# Patient Record
Sex: Female | Born: 1985 | Race: White | Hispanic: No | Marital: Single | Smoking: Never smoker
Health system: Southern US, Community
[De-identification: ages and names within clinical notes are randomized; demographics above are authoritative.]

## PROBLEM LIST (undated history)

## (undated) DIAGNOSIS — I319 Disease of pericardium, unspecified: Secondary | ICD-10-CM

## (undated) DIAGNOSIS — R0789 Other chest pain: Secondary | ICD-10-CM

## (undated) DIAGNOSIS — K219 Gastro-esophageal reflux disease without esophagitis: Secondary | ICD-10-CM

## (undated) DIAGNOSIS — R55 Syncope and collapse: Secondary | ICD-10-CM

## (undated) DIAGNOSIS — R079 Chest pain, unspecified: Secondary | ICD-10-CM

## (undated) DIAGNOSIS — M549 Dorsalgia, unspecified: Secondary | ICD-10-CM

## (undated) DIAGNOSIS — I209 Angina pectoris, unspecified: Secondary | ICD-10-CM

## (undated) DIAGNOSIS — F419 Anxiety disorder, unspecified: Secondary | ICD-10-CM

## (undated) DIAGNOSIS — D369 Benign neoplasm, unspecified site: Secondary | ICD-10-CM

## (undated) DIAGNOSIS — R200 Anesthesia of skin: Secondary | ICD-10-CM

## (undated) DIAGNOSIS — J45909 Unspecified asthma, uncomplicated: Secondary | ICD-10-CM

## (undated) DIAGNOSIS — M79602 Pain in left arm: Secondary | ICD-10-CM

## (undated) HISTORY — DX: Disease of pericardium, unspecified: I31.9

## (undated) HISTORY — DX: Other chest pain: R07.89

## (undated) HISTORY — DX: Benign neoplasm, unspecified site: D36.9

## (undated) HISTORY — PX: WISDOM TOOTH EXTRACTION: SHX21

## (undated) HISTORY — DX: Anesthesia of skin: R20.0

## (undated) HISTORY — DX: Chest pain, unspecified: R07.9

## (undated) HISTORY — DX: Dorsalgia, unspecified: M54.9

## (undated) HISTORY — DX: Syncope and collapse: R55

## (undated) HISTORY — PX: TONSILLECTOMY: SUR1361

## (undated) HISTORY — DX: Anxiety disorder, unspecified: F41.9

## (undated) HISTORY — DX: Pain in left arm: M79.602

---

## 2014-12-17 ENCOUNTER — Encounter (HOSPITAL_COMMUNITY): Payer: Self-pay | Admitting: Emergency Medicine

## 2014-12-17 ENCOUNTER — Emergency Department (HOSPITAL_COMMUNITY)
Admission: EM | Admit: 2014-12-17 | Discharge: 2014-12-17 | Disposition: A | Discharge status: Home / Self Care | Payer: BLUE CROSS/BLUE SHIELD | Source: Home / Self Care | Attending: Family Medicine | Admitting: Family Medicine

## 2014-12-17 DIAGNOSIS — J029 Acute pharyngitis, unspecified: Secondary | ICD-10-CM

## 2014-12-17 LAB — POCT RAPID STREP A: Streptococcus, Group A Screen (Direct): NEGATIVE

## 2014-12-17 MED ORDER — AMOXICILLIN 500 MG PO CAPS: 500.0000 mg | ORAL_CAPSULE | Freq: Three times a day (TID) | ORAL | Status: DC

## 2014-12-17 NOTE — ED Notes (Signed)
Pt has been suffering from a high fever and sore throat for 3-4 days.  Pt has been taking Day-Quil and Ibuprofen.

## 2014-12-17 NOTE — ED Provider Notes (Signed)
CSN: [id number]     Arrival date & time 12/17/14  1355 History   First MD Initiated Contact with Patient 12/17/14 1440     Chief Complaint  Patient presents with  . Sore Throat  . Fever   (Consider location/radiation/quality/duration/timing/severity/associated sxs/prior Treatment) HPI  History reviewed. No pertinent past medical history. Past Surgical History  Procedure Laterality Date  . Tonsillectomy    . Wisdom tooth extraction     History reviewed. No pertinent family history. History  Substance Use Topics  . Smoking status: Never Smoker   . Smokeless tobacco: Never Used  . Alcohol Use: Yes     Comment: occasiona   OB History    No data available     Review of Systems  Allergies  Review of patient's allergies indicates no known allergies.  Home Medications   Prior to Admission medications   Medication Sig Start Date End Date Taking? Authorizing Provider  ibuprofen (ADVIL,MOTRIN) 400 MG tablet Take 400 mg by mouth every 6 (six) hours as needed for fever.   Yes Historical Provider, MD  amoxicillin (AMOXIL) 500 MG capsule Take 1 capsule (500 mg total) by mouth 3 (three) times daily. 12/17/14   Billy Fischer, MD   BP 120/79 mmHg  Pulse 109  Temp(Src) 102.7 F (39.3 C) (Oral)  Resp 16  SpO2 100%  LMP 11/19/2014 (Exact Date) Physical Exam  Nursing note and vitals reviewed.   ED Course  Procedures (including critical care time) Labs Review Labs Reviewed  POCT RAPID STREP A    Imaging Review No results found.   MDM   1. Acute pharyngitis, unspecified pharyngitis type     Patient seen by Dr. Juventino Slovak.  Refer to his note  Robyn Haber, MD   Robyn Haber, MD 12/17/14 [phone]

## 2014-12-17 NOTE — Discharge Instructions (Signed)
Drink lots of fluids, take all of medicine, use lozenges as needed.return if needed °

## 2014-12-17 NOTE — ED Provider Notes (Signed)
CSN: [id number]     Arrival date & time 12/17/14  1355 History   First MD Initiated Contact with Patient 12/17/14 1440     Chief Complaint  Patient presents with  . Sore Throat  . Fever   (Consider location/radiation/quality/duration/timing/severity/associated sxs/prior Treatment) Patient is a 29 y.o. female presenting with pharyngitis and fever. The history is provided by the patient.  Sore Throat This is a new problem. The current episode started more than 2 days ago. The problem has been gradually worsening. Pertinent negatives include no chest pain and no abdominal pain. The symptoms are aggravated by swallowing.  Fever Associated symptoms: chills and sore throat   Associated symptoms: no chest pain and no cough     History reviewed. No pertinent past medical history. Past Surgical History  Procedure Laterality Date  . Tonsillectomy    . Wisdom tooth extraction     History reviewed. No pertinent family history. History  Substance Use Topics  . Smoking status: Never Smoker   . Smokeless tobacco: Never Used  . Alcohol Use: Yes     Comment: occasiona   OB History    No data available     Review of Systems  Constitutional: Positive for fever, chills and appetite change.  HENT: Positive for sore throat.   Respiratory: Negative for cough.   Cardiovascular: Negative for chest pain.  Gastrointestinal: Negative.  Negative for abdominal pain.    Allergies  Review of patient's allergies indicates no known allergies.  Home Medications   Prior to Admission medications   Medication Sig Start Date End Date Taking? Authorizing Provider  ibuprofen (ADVIL,MOTRIN) 400 MG tablet Take 400 mg by mouth every 6 (six) hours as needed for fever.   Yes Historical Provider, MD  amoxicillin (AMOXIL) 500 MG capsule Take 1 capsule (500 mg total) by mouth 3 (three) times daily. 12/17/14   Billy Fischer, MD   BP 120/79 mmHg  Pulse 109  Temp(Src) 102.7 F (39.3 C) (Oral)  Resp 16  SpO2 100%   LMP 11/19/2014 (Exact Date) Physical Exam  Constitutional: She appears well-developed and well-nourished.  HENT:  Right Ear: External ear normal.  Left Ear: External ear normal.  Mouth/Throat: Oropharyngeal exudate present.  Eyes: Conjunctivae are normal. Pupils are equal, round, and reactive to light.  Neck: Normal range of motion. Neck supple.  Lymphadenopathy:    She has cervical adenopathy.  Nursing note and vitals reviewed.   ED Course  Procedures (including critical care time) Labs Review Labs Reviewed  POCT RAPID STREP A  strep neg.  Imaging Review No results found.   MDM   1. Acute pharyngitis, unspecified pharyngitis type        Billy Fischer, MD 12/17/14 1500

## 2014-12-19 LAB — CULTURE, GROUP A STREP: Strep A Culture: NEGATIVE

## 2014-12-20 NOTE — ED Notes (Signed)
Final report of strep test negative for strep

## 2015-06-06 ENCOUNTER — Encounter (HOSPITAL_COMMUNITY): Payer: Self-pay

## 2015-06-06 ENCOUNTER — Emergency Department (HOSPITAL_COMMUNITY)
Admission: EM | Admit: 2015-06-06 | Discharge: 2015-06-06 | Disposition: A | Discharge status: Home / Self Care | Payer: BLUE CROSS/BLUE SHIELD | Source: Home / Self Care

## 2015-06-06 DIAGNOSIS — K529 Noninfective gastroenteritis and colitis, unspecified: Secondary | ICD-10-CM

## 2015-06-06 MED ORDER — ONDANSETRON HCL 4 MG PO TABS: 4.0000 mg | ORAL_TABLET | Freq: Four times a day (QID) | ORAL | Status: DC

## 2015-06-06 MED ORDER — ONDANSETRON 4 MG PO TBDP
ORAL_TABLET | ORAL | Status: AC
  Filled 2015-06-06: qty 1

## 2015-06-06 MED ORDER — ONDANSETRON 4 MG PO TBDP
4.0000 mg | ORAL_TABLET | Freq: Once | ORAL | Status: AC
  Administered 2015-06-06: 4 mg via ORAL

## 2015-06-06 NOTE — Discharge Instructions (Signed)

## 2015-06-06 NOTE — ED Provider Notes (Signed)
CSN: [id number]     Arrival date & time 06/06/15  1301 History   None    Chief Complaint  Patient presents with  . GI Problem   (Consider location/radiation/quality/duration/timing/severity/associated sxs/prior Treatment) HPI History obtained from patient:   LOCATION:abdomen SEVERITY: DURATION:2 days CONTEXT:symptoms xmas eve better christmas day but since 3 am constant vomiting and diarrhea QUALITY: MODIFYING FACTORS: unable to drink without vomiting ASSOCIATED SYMPTOMS:diarrhea TIMING:constant OCCUPATION:  History reviewed. No pertinent past medical history. Past Surgical History  Procedure Laterality Date  . Tonsillectomy    . Wisdom tooth extraction     History reviewed. No pertinent family history. Social History  Substance Use Topics  . Smoking status: Never Smoker   . Smokeless tobacco: Never Used  . Alcohol Use: Yes     Comment: occasiona   OB History    No data available     Review of Systems ROS +'ve vomiting, diarrhea  Denies: HEADACHE, NAUSEA, ABDOMINAL PAIN, CHEST PAIN, CONGESTION, DYSURIA, SHORTNESS OF BREATH  Allergies  Review of patient's allergies indicates no known allergies.  Home Medications   Prior to Admission medications   Medication Sig Start Date End Date Taking? Authorizing Provider  amoxicillin (AMOXIL) 500 MG capsule Take 1 capsule (500 mg total) by mouth 3 (three) times daily. 12/17/14   Billy Fischer, MD  ibuprofen (ADVIL,MOTRIN) 400 MG tablet Take 400 mg by mouth every 6 (six) hours as needed for fever.    Historical Provider, MD   Meds Ordered and Administered this Visit   Medications  ondansetron (ZOFRAN-ODT) disintegrating tablet 4 mg (not administered)    BP 125/85 mmHg  Pulse 97  Temp(Src) 98.6 F (37 C) (Oral)  Resp 16  SpO2 100% No data found.   Physical Exam  Constitutional: She is oriented to person, place, and time. She appears well-developed and well-nourished. No distress.  HENT:  Head: Normocephalic and  atraumatic.  Eyes: Conjunctivae are normal.  Cardiovascular: Normal rate, regular rhythm and normal heart sounds.   Pulmonary/Chest: Effort normal.  Abdominal: Soft. Bowel sounds are normal. There is no tenderness. There is no rebound and no guarding.  Musculoskeletal: Normal range of motion.  Neurological: She is alert and oriented to person, place, and time.  Skin: Skin is warm and dry.  Psychiatric: She has a normal mood and affect. Her behavior is normal. Judgment and thought content normal.  Nursing note and vitals reviewed.   ED Course  Procedures (including critical care time)  Labs Review Labs Reviewed - No data to display  Imaging Review No results found.   Visual Acuity Review  Right Eye Distance:   Left Eye Distance:   Bilateral Distance:    Right Eye Near:   Left Eye Near:    Bilateral Near:        Medication improved symptoms, able to take clear liquids prior to discharge from UC.  MDM   1. Gastroenteritis     Patient is advised to continue home symptomatic treatment. Prescription for zofran is provided to the patient. Patient is advised that if there are new or worsening symptoms or attend the emergency department, or contact primary care provider. Instructions of care provided discharged home in stable condition.  THIS NOTE WAS GENERATED USING A VOICE RECOGNITION SOFTWARE PROGRAM. ALL REASONABLE EFFORTS  WERE MADE TO PROOFREAD THIS DOCUMENT FOR ACCURACY.          Konrad Felix, PA 06/06/15 [phone]

## 2015-06-06 NOTE — ED Notes (Signed)
Here w her partner who was ill w GI problem a couple of days ago. C/o general GI discomfort, n/v/d. NAD

## 2015-07-31 ENCOUNTER — Emergency Department (HOSPITAL_COMMUNITY)
Admission: EM | Admit: 2015-07-31 | Discharge: 2015-07-31 | Disposition: A | Discharge status: Home / Self Care | Payer: BLUE CROSS/BLUE SHIELD | Source: Home / Self Care | Attending: Family Medicine | Admitting: Family Medicine

## 2015-07-31 ENCOUNTER — Encounter (HOSPITAL_COMMUNITY): Payer: Self-pay | Admitting: *Deleted

## 2015-07-31 DIAGNOSIS — K047 Periapical abscess without sinus: Secondary | ICD-10-CM | POA: Diagnosis not present

## 2015-07-31 MED ORDER — HYDROCODONE-ACETAMINOPHEN 5-325 MG PO TABS: 1.0000 | ORAL_TABLET | Freq: Four times a day (QID) | ORAL | Status: DC | PRN

## 2015-07-31 MED ORDER — CLINDAMYCIN HCL 150 MG PO CAPS: 150.0000 mg | ORAL_CAPSULE | Freq: Four times a day (QID) | ORAL | Status: DC

## 2015-07-31 NOTE — Discharge Instructions (Signed)
Take medicine as prescribed, see your dentist as soon as possible °

## 2015-07-31 NOTE — ED Notes (Signed)
Started with right upper toothache 2-3 days ago; yesterday started with swelling; today pain and swelling worse.  Has been taking IBU without any relief.

## 2015-07-31 NOTE — ED Provider Notes (Signed)
CSN: [id number]     Arrival date & time 07/31/15  1609 History   First MD Initiated Contact with Patient 07/31/15 1811     Chief Complaint  Patient presents with  . Dental Pain   (Consider location/radiation/quality/duration/timing/severity/associated sxs/prior Treatment) Patient is a 30 y.o. female presenting with tooth pain. The history is provided by the patient.  Dental Pain Location:  Upper Upper teeth location:  5/RU 1st bicuspid Quality:  Throbbing Severity:  Moderate Onset quality:  Gradual Duration:  3 days Chronicity:  New Context: abscess     History reviewed. No pertinent past medical history. Past Surgical History  Procedure Laterality Date  . Tonsillectomy    . Wisdom tooth extraction     No family history on file. Social History  Substance Use Topics  . Smoking status: Never Smoker   . Smokeless tobacco: Never Used  . Alcohol Use: Yes     Comment: socially   OB History    No data available     Review of Systems  Constitutional: Negative.   HENT: Positive for dental problem.   All other systems reviewed and are negative.   Allergies  Review of patient's allergies indicates no known allergies.  Home Medications   Prior to Admission medications   Medication Sig Start Date End Date Taking? Authorizing Provider  ibuprofen (ADVIL,MOTRIN) 400 MG tablet Take 400 mg by mouth every 6 (six) hours as needed for fever.   Yes Historical Provider, MD  amoxicillin (AMOXIL) 500 MG capsule Take 1 capsule (500 mg total) by mouth 3 (three) times daily. 12/17/14   Billy Fischer, MD  clindamycin (CLEOCIN) 150 MG capsule Take 1 capsule (150 mg total) by mouth 4 (four) times daily. 07/31/15   Billy Fischer, MD  HYDROcodone-acetaminophen (NORCO/VICODIN) 5-325 MG tablet Take 1 tablet by mouth every 6 (six) hours as needed. 07/31/15   Billy Fischer, MD  ondansetron (ZOFRAN) 4 MG tablet Take 1 tablet (4 mg total) by mouth every 6 (six) hours. 06/06/15   Konrad Felix, PA    Meds Ordered and Administered this Visit  Medications - No data to display  BP 131/82 mmHg  Pulse 63  Temp(Src) 98.5 F (36.9 C) (Oral)  Resp 17  SpO2 100%  LMP 07/10/2015 (Approximate) No data found.   Physical Exam  Constitutional: She appears well-developed and well-nourished. She appears distressed.  HENT:  Mouth/Throat: Dental abscesses present.    Nursing note and vitals reviewed.   ED Course  Procedures (including critical care time)  Labs Review Labs Reviewed - No data to display  Imaging Review No results found.   Visual Acuity Review  Right Eye Distance:   Left Eye Distance:   Bilateral Distance:    Right Eye Near:   Left Eye Near:    Bilateral Near:         MDM   1. Dental abscess    Meds ordered this encounter  Medications  . clindamycin (CLEOCIN) 150 MG capsule    Sig: Take 1 capsule (150 mg total) by mouth 4 (four) times daily.    Dispense:  28 capsule    Refill:  0  . HYDROcodone-acetaminophen (NORCO/VICODIN) 5-325 MG tablet    Sig: Take 1 tablet by mouth every 6 (six) hours as needed.    Dispense:  6 tablet    Refill:  0       Billy Fischer, MD 07/31/15 [phone]

## 2016-02-10 DIAGNOSIS — H9312 Tinnitus, left ear: Secondary | ICD-10-CM | POA: Insufficient documentation

## 2016-02-10 DIAGNOSIS — H93292 Other abnormal auditory perceptions, left ear: Secondary | ICD-10-CM | POA: Insufficient documentation

## 2016-09-12 ENCOUNTER — Emergency Department (HOSPITAL_COMMUNITY): Payer: BLUE CROSS/BLUE SHIELD

## 2016-09-12 ENCOUNTER — Encounter (HOSPITAL_COMMUNITY): Payer: Self-pay

## 2016-09-12 ENCOUNTER — Emergency Department (HOSPITAL_COMMUNITY)
Admission: EM | Admit: 2016-09-12 | Discharge: 2016-09-12 | Disposition: A | Discharge status: Home / Self Care | Payer: BLUE CROSS/BLUE SHIELD | Attending: Emergency Medicine | Admitting: Emergency Medicine

## 2016-09-12 DIAGNOSIS — B9789 Other viral agents as the cause of diseases classified elsewhere: Secondary | ICD-10-CM

## 2016-09-12 DIAGNOSIS — J069 Acute upper respiratory infection, unspecified: Secondary | ICD-10-CM

## 2016-09-12 DIAGNOSIS — Z79899 Other long term (current) drug therapy: Secondary | ICD-10-CM | POA: Insufficient documentation

## 2016-09-12 DIAGNOSIS — R05 Cough: Secondary | ICD-10-CM | POA: Diagnosis present

## 2016-09-12 LAB — RAPID STREP SCREEN (MED CTR MEBANE ONLY): STREPTOCOCCUS, GROUP A SCREEN (DIRECT): NEGATIVE

## 2016-09-12 MED ORDER — BENZONATATE 100 MG PO CAPS: 100.0000 mg | ORAL_CAPSULE | Freq: Three times a day (TID) | ORAL | 0 refills | Status: DC

## 2016-09-12 NOTE — ED Notes (Signed)
Bed: WA01 Expected date:  Expected time:  Means of arrival:  Comments: 

## 2016-09-12 NOTE — ED Provider Notes (Signed)
[facility] DEPT Provider Note   CSN: [id number] Arrival date & time: 09/12/16  0124     History   Chief Complaint Chief Complaint  Patient presents with  . Cough  . Fever    HPI Tracey Avila is a 31 y.o. female.  The history is provided by the patient and medical records. No language interpreter was used.  Cough  Associated symptoms include chills. Pertinent negatives include no chest pain, no headaches, no sore throat and no shortness of breath.  Fever   Associated symptoms include congestion and cough. Pertinent negatives include no chest pain, no diarrhea, no vomiting, no headaches and no sore throat.   Tracey Avila is an otherwise healthy 31 y.o. female  who presents to the Emergency Department complaining of persistent dry cough and fever x 1 day. associated symptoms include nausea and vomiting. Patient states that she had 3 episodes of emesis yesterday. She has not thrown up today, but has had coughing fits leading to posttussive emesis. Temperature of 103.5 just prior to arrival. She took Tylenol Cold and flu before coming into the ER. No sick contacts. No sore throat, abdominal pain, diarrhea, chest pain, shortness of breath, dysuria or back pain.  History reviewed. No pertinent past medical history.  There are no active problems to display for this patient.   Past Surgical History:  Procedure Laterality Date  . TONSILLECTOMY    . WISDOM TOOTH EXTRACTION      OB History    No data available       Home Medications    Prior to Admission medications   Medication Sig Start Date End Date Taking? Authorizing Provider  amoxicillin (AMOXIL) 500 MG capsule Take 1 capsule (500 mg total) by mouth 3 (three) times daily. 12/17/14   Billy Fischer, MD  benzonatate (TESSALON) 100 MG capsule Take 1 capsule (100 mg total) by mouth every 8 (eight) hours. 09/12/16   Ozella Almond Adriyana Greenbaum, PA-C  clindamycin (CLEOCIN) 150 MG capsule Take 1 capsule (150 mg total) by mouth 4 (four)  times daily. 07/31/15   Billy Fischer, MD  HYDROcodone-acetaminophen (NORCO/VICODIN) 5-325 MG tablet Take 1 tablet by mouth every 6 (six) hours as needed. 07/31/15   Billy Fischer, MD  ibuprofen (ADVIL,MOTRIN) 400 MG tablet Take 400 mg by mouth every 6 (six) hours as needed for fever.    Historical Provider, MD  ondansetron (ZOFRAN) 4 MG tablet Take 1 tablet (4 mg total) by mouth every 6 (six) hours. 06/06/15   Konrad Felix, [place]    Family History History reviewed. No pertinent family history.  Social History Social History  Substance Use Topics  . Smoking status: Never Smoker  . Smokeless tobacco: Never Used  . Alcohol use Yes     Comment: socially     Allergies   Patient has no known allergies.   Review of Systems Review of Systems  Constitutional: Positive for chills and fever.  HENT: Positive for congestion. Negative for sore throat.   Eyes: Negative for visual disturbance.  Respiratory: Positive for cough. Negative for shortness of breath.   Cardiovascular: Negative for chest pain.  Gastrointestinal: Positive for nausea. Negative for abdominal pain, blood in stool, diarrhea and vomiting.  Genitourinary: Negative for dysuria.  Musculoskeletal: Negative for back pain and neck pain.  Skin: Negative for rash.  Neurological: Negative for headaches.     Physical Exam Updated Vital Signs BP 110/77 (BP Location: Right Arm)   Pulse 80   Temp 99.9 F (  37.7 C) (Oral)   Resp 20   Ht 5\' 6"  (1.676 m)   Wt 84.8 kg   LMP 09/12/2016   SpO2 98%   BMI 30.18 kg/m   Physical Exam  Constitutional: She is oriented to person, place, and time. She appears well-developed and well-nourished. No distress.  HENT:  Head: Normocephalic and atraumatic.  OP with erythema, no exudates or tonsillar hypertrophy. + nasal congestion with mucosal edema.   Neck: Normal range of motion. Neck supple.  No meningeal signs.   Cardiovascular: Normal rate, regular rhythm and normal heart sounds.     Pulmonary/Chest: Effort normal.  Lungs are clear to auscultation bilaterally - no w/r/r  Abdominal: Soft. She exhibits no distension. There is no tenderness.  Musculoskeletal: Normal range of motion.  Neurological: She is alert and oriented to person, place, and time.  Skin: Skin is warm and dry. She is not diaphoretic.  Nursing note and vitals reviewed.    ED Treatments / Results  Labs (all labs ordered are listed, but only abnormal results are displayed) Labs Reviewed  RAPID STREP SCREEN (NOT AT [facility])  CULTURE, GROUP A STREP [facility])    EKG  EKG Interpretation None       Radiology Dg Chest 2 View  Result Date: 09/12/2016 CLINICAL DATA:  31 y/o  F; cough and fever. EXAM: CHEST  2 VIEW COMPARISON:  None. FINDINGS: The heart size and mediastinal contours are within normal limits. Both lungs are clear. Mild levocurvature of the thoracolumbar junction. No acute osseous abnormality is evident. IMPRESSION: No active cardiopulmonary disease. Electronically Signed   By: Kristine Garbe M.D.   On: 09/12/2016 03:21    Procedures Procedures (including critical care time)  Medications Ordered in ED Medications - No data to display   Initial Impression / Assessment and Plan / ED Course  I have reviewed the triage vital signs and the nursing notes.  Pertinent labs & imaging results that were available during my care of the patient were reviewed by me and considered in my medical decision making (see chart for details).    Tracey Avila is afebrile, non-toxic appearing with a clear lung exam. Mild rhinorrhea and OP with erythema, no exudates. CXR normal. Rapid strep negative. Likely viral URI. Patient is agreeable to symptomatic treatment with close follow up with PCP as needed but spoke at length about emergent, changing, or worsening of symptoms that should prompt return to ER. Patient voices understanding and is agreeable to plan.   Blood pressure 110/77, pulse 80,  temperature 99.9 F (37.7 C), temperature source Oral, resp. rate 20, height 5\' 6"  (1.676 m), weight 84.8 kg, last menstrual period 09/12/2016, SpO2 98 %.   Final Clinical Impressions(s) / ED Diagnoses   Final diagnoses:  Viral URI with cough    New Prescriptions Discharge Medication List as of 09/12/2016  4:01 AM    START taking these medications   Details  benzonatate (TESSALON) 100 MG capsule Take 1 capsule (100 mg total) by mouth every 8 (eight) hours., Starting Wed 09/12/2016, Print         [employer] Claudeen Leason, PA-C 09/12/16 [place], MD 09/12/16 [phone]

## 2016-09-12 NOTE — ED Triage Notes (Signed)
Pt complains of a fever and cough for two days, pt took tylenol cold and flu prior to coming

## 2016-09-12 NOTE — Discharge Instructions (Signed)
Flonase and mucinex for nasal congestion, tessalon as needed for cough. Tylenol or ibuprofen as needed for fevers. Rest, drink plenty of fluids to be sure you are staying hydrated. Please follow up with your primary doctor for discussion of your diagnoses and further evaluation after today's visit if symptoms persist longer than 7 days; Return to the ER for high fevers, difficulty breathing or other concerning symptoms

## 2016-09-14 LAB — CULTURE, GROUP A STREP (THRC)

## 2019-05-04 ENCOUNTER — Other Ambulatory Visit: Payer: Self-pay

## 2019-05-04 DIAGNOSIS — Z20822 Contact with and (suspected) exposure to covid-19: Secondary | ICD-10-CM

## 2019-05-06 LAB — NOVEL CORONAVIRUS, NAA: SARS-CoV-2, NAA: NOT DETECTED

## 2021-03-21 ENCOUNTER — Other Ambulatory Visit: Payer: Self-pay

## 2021-03-21 ENCOUNTER — Encounter (HOSPITAL_COMMUNITY): Payer: Self-pay | Admitting: Emergency Medicine

## 2021-03-21 ENCOUNTER — Ambulatory Visit (HOSPITAL_COMMUNITY)
Admission: EM | Admit: 2021-03-21 | Discharge: 2021-03-21 | Disposition: A | Discharge status: Home / Self Care | Payer: BC Managed Care – PPO | Attending: Emergency Medicine | Admitting: Emergency Medicine

## 2021-03-21 DIAGNOSIS — F410 Panic disorder [episodic paroxysmal anxiety] without agoraphobia: Secondary | ICD-10-CM | POA: Diagnosis not present

## 2021-03-21 DIAGNOSIS — R0789 Other chest pain: Secondary | ICD-10-CM

## 2021-03-21 NOTE — Discharge Instructions (Addendum)
-  Your EKG looks good today.  I think that probably your chest pain is more related to panic attacks/stressors, but I cannot completely rule out a cardiac event at urgent care.  If chest pain persist, follow-up with your primary care to discuss outpatient cardiology work-up.  If symptoms worsen, or new concerning symptoms, please head to the emergency department or call 911.

## 2021-03-21 NOTE — ED Triage Notes (Signed)
Pt reports having intermittent chest pain and left arm pains for over a week. Today got worse as driving to work. Seeing a doctor for panic attacks.

## 2021-03-21 NOTE — ED Provider Notes (Signed)
[place]    CSN: [id number] Arrival date & time: 03/21/21  4627      History   Chief Complaint Chief Complaint  Patient presents with   Chest Pain   Arm Pain    HPI Baylei Siebels is a 35 y.o. female presenting with chest pain and left arm pain for about 1 week.  Medical history noncontributory.  Notes recent increase in panic attacks, which she thinks is contributing.  States that the last week, she has had some left-sided chest pain and left arm and burning associated with lightheadedness.  This typically lasts for about 30 minutes and resolves on its own, but today she was driving to work and it happened and it has lasted longer than normal.  Denies shortness of breath, current dizziness, weakness, syncope.  Denies personal history of cardiopulmonary disease.  Denies family history of early cardiac death.  HPI  History reviewed. No pertinent past medical history.  There are no problems to display for this patient.   Past Surgical History:  Procedure Laterality Date   TONSILLECTOMY     WISDOM TOOTH EXTRACTION      OB History   No obstetric history on file.      Home Medications    Prior to Admission medications   Medication Sig Start Date End Date Taking? Authorizing Provider  amoxicillin (AMOXIL) 500 MG capsule Take 1 capsule (500 mg total) by mouth 3 (three) times daily. 12/17/14   Billy Fischer, MD  benzonatate (TESSALON) 100 MG capsule Take 1 capsule (100 mg total) by mouth every 8 (eight) hours. 09/12/16   Ward, Ozella Almond, PA-C  clindamycin (CLEOCIN) 150 MG capsule Take 1 capsule (150 mg total) by mouth 4 (four) times daily. 07/31/15   Billy Fischer, MD  HYDROcodone-acetaminophen (NORCO/VICODIN) 5-325 MG tablet Take 1 tablet by mouth every 6 (six) hours as needed. 07/31/15   Billy Fischer, MD  ibuprofen (ADVIL,MOTRIN) 400 MG tablet Take 400 mg by mouth every 6 (six) hours as needed for fever.    [provider]  ondansetron (ZOFRAN) 4 MG  tablet Take 1 tablet (4 mg total) by mouth every 6 (six) hours. 06/06/15   Konrad Felix, PA    Family History No family history on file.  Social History Social History   Tobacco Use   Smoking status: Never   Smokeless tobacco: Never  Substance Use Topics   Alcohol use: Yes    Comment: socially   Drug use: No     Allergies   Patient has no known allergies.   Review of Systems Review of Systems  Cardiovascular:  Positive for chest pain.  Psychiatric/Behavioral:  The patient is nervous/anxious.   All other systems reviewed and are negative.   Physical Exam Triage Vital Signs ED Triage Vitals  Enc Vitals Group     BP 03/21/21 0919 (!) 144/81     Pulse Rate 03/21/21 0919 81     Resp 03/21/21 0919 20     Temp 03/21/21 0919 99 F (37.2 C)     Temp Source 03/21/21 0919 Oral     SpO2 03/21/21 0919 100 %     Weight --      Height --      Head Circumference --      Peak Flow --      Pain Score 03/21/21 0917 3     Pain Loc --      Pain Edu? --  Excl. in GC? --    No data found.  Updated Vital Signs BP (!) 144/81 (BP Location: Left Arm)   Pulse 81   Temp 99 F (37.2 C) (Oral)   Resp 20   LMP 03/09/2021   SpO2 100%   Visual Acuity Right Eye Distance:   Left Eye Distance:   Bilateral Distance:    Right Eye Near:   Left Eye Near:    Bilateral Near:     Physical Exam Vitals reviewed.  Constitutional:      Appearance: Normal appearance. She is not diaphoretic.  HENT:     Head: Normocephalic and atraumatic.     Mouth/Throat:     Mouth: Mucous membranes are moist.  Eyes:     Extraocular Movements: Extraocular movements intact.     Pupils: Pupils are equal, round, and reactive to light.  Cardiovascular:     Rate and Rhythm: Normal rate and regular rhythm.     Pulses:          Radial pulses are 2+ on the right side and 2+ on the left side.     Heart sounds: Normal heart sounds.  Pulmonary:     Effort: Pulmonary effort is normal.     Breath  sounds: Normal breath sounds.  Chest:     Chest wall: Tenderness present.     Comments: Left anterior chest wall pain to palpation  Abdominal:     Palpations: Abdomen is soft.     Tenderness: There is no abdominal tenderness. There is no guarding or rebound.  Musculoskeletal:     Right lower leg: No edema.     Left lower leg: No edema.  Skin:    General: Skin is warm.     Capillary Refill: Capillary refill takes less than 2 seconds.  Neurological:     General: No focal deficit present.     Mental Status: She is alert and oriented to person, place, and time.  Psychiatric:        Mood and Affect: Mood normal.        Behavior: Behavior normal.        Thought Content: Thought content normal.        Judgment: Judgment normal.     UC Treatments / Results  Labs (all labs ordered are listed, but only abnormal results are displayed) Labs Reviewed - No data to display  EKG   Radiology No results found.  Procedures Procedures (including critical care time)  Medications Ordered in UC Medications - No data to display  Initial Impression / Assessment and Plan / UC Course  I have reviewed the triage vital signs and the nursing notes.  Pertinent labs & imaging results that were available during my care of the patient were reviewed by me and considered in my medical decision making (see chart for details).     This patient is a very pleasant 35 y.o. year old female presenting with chest pain and panic attacks. No recent URI/illness.   Chest pain is reproducible, though I do also suspect there is an anxiety/panic attack component. EKG today NSR. No prior EKG for comparison.   Strict ED return precautions discussed. Patient verbalizes understanding and agreement.  .   Final Clinical Impressions(s) / UC Diagnoses   Final diagnoses:  Atypical chest pain  Panic attack     Discharge Instructions      -Your EKG looks good today.  I think that probably your chest pain is more  related to  panic attacks/stressors, but I cannot completely rule out a cardiac event at urgent care.  If chest pain persist, follow-up with your primary care to discuss outpatient cardiology work-up.  If symptoms worsen, or new concerning symptoms, please head to the emergency department or call 911.     ED Prescriptions   None    PDMP not reviewed this encounter.   Hazel Sams, PA-C 03/21/21 1023

## 2021-03-22 ENCOUNTER — Other Ambulatory Visit (HOSPITAL_BASED_OUTPATIENT_CLINIC_OR_DEPARTMENT_OTHER): Payer: Self-pay | Admitting: Orthopaedic Surgery

## 2021-03-22 DIAGNOSIS — M25511 Pain in right shoulder: Secondary | ICD-10-CM

## 2021-03-23 ENCOUNTER — Ambulatory Visit (INDEPENDENT_AMBULATORY_CARE_PROVIDER_SITE_OTHER): Payer: BC Managed Care – PPO | Admitting: Orthopaedic Surgery

## 2021-03-23 ENCOUNTER — Ambulatory Visit (HOSPITAL_BASED_OUTPATIENT_CLINIC_OR_DEPARTMENT_OTHER)
Admission: RE | Admit: 2021-03-23 | Discharge: 2021-03-23 | Disposition: A | Discharge status: Home / Self Care | Payer: BC Managed Care – PPO | Source: Ambulatory Visit | Attending: Orthopaedic Surgery | Admitting: Orthopaedic Surgery

## 2021-03-23 ENCOUNTER — Other Ambulatory Visit: Payer: Self-pay

## 2021-03-23 VITALS — Ht 66.0 in | Wt 190.0 lb

## 2021-03-23 DIAGNOSIS — M25511 Pain in right shoulder: Secondary | ICD-10-CM | POA: Insufficient documentation

## 2021-03-23 DIAGNOSIS — M7501 Adhesive capsulitis of right shoulder: Secondary | ICD-10-CM | POA: Diagnosis not present

## 2021-03-23 MED ORDER — TRIAMCINOLONE ACETONIDE 40 MG/ML IJ SUSP
80.0000 mg | INTRAMUSCULAR | Status: AC | PRN
  Administered 2021-03-23: 80 mg via INTRA_ARTICULAR

## 2021-03-23 MED ORDER — LIDOCAINE HCL 1 % IJ SOLN
4.0000 mL | INTRAMUSCULAR | Status: AC | PRN
  Administered 2021-03-23: 4 mL

## 2021-03-23 NOTE — Progress Notes (Signed)
Chief Complaint: right shoulder pain    History of Present Illness:   Tracey Avila is a 35 y.o. female ambidextrous female with right shoulder pain after a tetanus shot performed on March 19, 2021.  At that time she is experienced sharp pain throughout the glenohumeral joint.  She has pain with any type of range of motion.  She feels a sharp stabbing type pain when she goes to reach for something.  She currently works in a [employer].  She denies any history of thyroid or diabetes.  She has been taking naproxen which helps somewhat.    Surgical History:   None  PMH/PSH/Family History/Social History/Meds/Allergies:   No past medical history on file. Past Surgical History:  Procedure Laterality Date  . TONSILLECTOMY    . WISDOM TOOTH EXTRACTION     Social History   Socioeconomic History  . Marital status: Single    Spouse name: Not on file  . Number of children: Not on file  . Years of education: Not on file  . Highest education level: Not on file  Occupational History  . Not on file  Tobacco Use  . Smoking status: Never  . Smokeless tobacco: Never  Substance and Sexual Activity  . Alcohol use: Yes    Comment: socially  . Drug use: No  . Sexual activity: Not on file  Other Topics Concern  . Not on file  Social History Narrative  . Not on file   Social Determinants of Health   Financial Resource Strain: Not on file  Food Insecurity: Not on file  Transportation Needs: Not on file  Physical Activity: Not on file  Stress: Not on file  Social Connections: Not on file   No family history on file. No Known Allergies Current Outpatient Medications  Medication Sig Dispense Refill  . amoxicillin (AMOXIL) 500 MG capsule Take 1 capsule (500 mg total) by mouth 3 (three) times daily. 30 capsule 0  . benzonatate (TESSALON) 100 MG capsule Take 1 capsule (100 mg total) by mouth every 8 (eight) hours. 21 capsule 0  . clindamycin (CLEOCIN) 150  MG capsule Take 1 capsule (150 mg total) by mouth 4 (four) times daily. 28 capsule 0  . HYDROcodone-acetaminophen (NORCO/VICODIN) 5-325 MG tablet Take 1 tablet by mouth every 6 (six) hours as needed. 6 tablet 0  . ibuprofen (ADVIL,MOTRIN) 400 MG tablet Take 400 mg by mouth every 6 (six) hours as needed for fever.    . ondansetron (ZOFRAN) 4 MG tablet Take 1 tablet (4 mg total) by mouth every 6 (six) hours. 12 tablet 0   No current facility-administered medications for this visit.   No results found.  Review of Systems:   A ROS was performed including pertinent positives and negatives as documented in the HPI.  Physical Exam :   Constitutional: NAD and appears stated age Neurological: Alert and oriented Psych: Appropriate affect and cooperative Height 5\' 6"  (1.676 m), weight 190 lb (86.2 kg), last menstrual period 03/09/2021.   Comprehensive Musculoskeletal Exam:    Musculoskeletal Exam    Inspection Right Left  Skin No atrophy or winging No atrophy or winging  Palpation    Tenderness Glenohumeral None  Range of Motion    Flexion (passive) 170 170  Flexion (active) 170 170  Abduction 170 170  ER at  the side 50 with pain 70  Can reach behind back to T12 T12  Strength     5 out of 5 5 out of 5  Special Tests    Pseudoparalytic No No  Neurologic    Fires PIN, radial, median, ulnar, musculocutaneous, axillary, suprascapular, long thoracic, and spinal accessory innervated muscles. No abnormal sensibility  Vascular/Lymphatic    Radial Pulse 2+ 2+  Cervical Exam    Patient has symmetric cervical range of motion with negative Spurling's test.  Special Test: Pain is recreated with passive external rotation     Imaging:   Xray (3 views right shoulder): Normal   I personally reviewed and interpreted the radiographs.   Assessment:   35 year old female with right shoulder pain consistent with adhesive capsulitis.  This time I like to perform ultrasound-guided injection of the  glenohumeral joint.  We have advised that I like to see her back in 2 weeks for reassessment for additional possible injection at that time.  I have advised that she not participate in any strenuous type activity.  Plan :    -Return to clinic in 2 weeks for reassessment    Procedure Note  Patient: Tracey Avila             Date of Birth: 28-Apr-1986           MRN: [id number]             Visit Date: 03/23/2021  Procedures: Visit Diagnoses: No diagnosis found.  Large Joint Inj: R glenohumeral on 03/23/2021 2:17 PM Indications: pain Details: 22 G 1.5 in needle, ultrasound-guided anterior approach  Arthrogram: No  Medications: 4 mL lidocaine 1 %; 80 mg triamcinolone acetonide 40 MG/ML Outcome: tolerated well, no immediate complications Procedure, treatment alternatives, risks and benefits explained, specific risks discussed. Consent was given by the patient. Immediately prior to procedure a time out was called to verify the correct patient, procedure, equipment, support staff and site/side marked as required. Patient was prepped and draped in the usual sterile fashion.         I personally saw and evaluated the patient, and participated in the management and treatment plan.  Vanetta Mulders, MD Attending Physician, Orthopedic Surgery  This document was dictated using Dragon voice recognition software. A reasonable attempt at proof reading has been made to minimize errors.

## 2021-04-03 ENCOUNTER — Emergency Department (HOSPITAL_COMMUNITY): Payer: BC Managed Care – PPO

## 2021-04-03 ENCOUNTER — Encounter (HOSPITAL_COMMUNITY): Payer: Self-pay | Admitting: Emergency Medicine

## 2021-04-03 ENCOUNTER — Emergency Department (HOSPITAL_COMMUNITY)
Admission: EM | Admit: 2021-04-03 | Discharge: 2021-04-03 | Disposition: A | Discharge status: Home / Self Care | Payer: BC Managed Care – PPO | Attending: Emergency Medicine | Admitting: Emergency Medicine

## 2021-04-03 ENCOUNTER — Other Ambulatory Visit: Payer: Self-pay

## 2021-04-03 DIAGNOSIS — R001 Bradycardia, unspecified: Secondary | ICD-10-CM | POA: Insufficient documentation

## 2021-04-03 DIAGNOSIS — R0789 Other chest pain: Secondary | ICD-10-CM | POA: Insufficient documentation

## 2021-04-03 DIAGNOSIS — R079 Chest pain, unspecified: Secondary | ICD-10-CM

## 2021-04-03 DIAGNOSIS — R202 Paresthesia of skin: Secondary | ICD-10-CM | POA: Insufficient documentation

## 2021-04-03 DIAGNOSIS — R109 Unspecified abdominal pain: Secondary | ICD-10-CM | POA: Insufficient documentation

## 2021-04-03 LAB — COMPREHENSIVE METABOLIC PANEL
ALT: 15 U/L (ref 0–44)
AST: 14 U/L — ABNORMAL LOW (ref 15–41)
Albumin: 4.1 g/dL (ref 3.5–5.0)
Alkaline Phosphatase: 49 U/L (ref 38–126)
Anion gap: 10 (ref 5–15)
BUN: 8 mg/dL (ref 6–20)
CO2: 23 mmol/L (ref 22–32)
Calcium: 9.4 mg/dL (ref 8.9–10.3)
Chloride: 102 mmol/L (ref 98–111)
Creatinine, Ser: 0.67 mg/dL (ref 0.44–1.00)
GFR, Estimated: >60 mL/min (ref >60)
Glucose, Bld: 111 mg/dL — ABNORMAL HIGH (ref 70–99)
Potassium: 3.6 mmol/L (ref 3.5–5.1)
Sodium: 135 mmol/L (ref 135–145)
Total Bilirubin: 0.7 mg/dL (ref 0.3–1.2)
Total Protein: 7.2 g/dL (ref 6.5–8.1)

## 2021-04-03 LAB — CBC WITH DIFFERENTIAL/PLATELET
Abs Immature Granulocytes: 0.05 10*3/uL (ref 0.00–0.07)
Basophils Absolute: 0.1 10*3/uL (ref 0.0–0.1)
Basophils Relative: 0 %
Eosinophils Absolute: 0.1 10*3/uL (ref 0.0–0.5)
Eosinophils Relative: 1 %
HCT: 42 % (ref 36.0–46.0)
Hemoglobin: 14.2 g/dL (ref 12.0–15.0)
Immature Granulocytes: 0 %
Lymphocytes Relative: 27 %
Lymphs Abs: 4 10*3/uL (ref 0.7–4.0)
MCH: 29.9 pg (ref 26.0–34.0)
MCHC: 33.8 g/dL (ref 30.0–36.0)
MCV: 88.4 fL (ref 80.0–100.0)
Monocytes Absolute: 1 10*3/uL (ref 0.1–1.0)
Monocytes Relative: 7 %
Neutro Abs: 9.6 10*3/uL — ABNORMAL HIGH (ref 1.7–7.7)
Neutrophils Relative %: 65 %
Platelets: 260 10*3/uL (ref 150–400)
RBC: 4.75 MIL/uL (ref 3.87–5.11)
RDW: 12.1 % (ref 11.5–15.5)
WBC: 14.8 10*3/uL — ABNORMAL HIGH (ref 4.0–10.5)
nRBC: 0 % (ref 0.0–0.2)

## 2021-04-03 LAB — TROPONIN I (HIGH SENSITIVITY)
Troponin I (High Sensitivity): 3 ng/L (ref <18)
Troponin I (High Sensitivity): 3 ng/L (ref <18)

## 2021-04-03 LAB — I-STAT BETA HCG BLOOD, ED (MC, WL, AP ONLY): I-stat hCG, quantitative: <5 m[IU]/mL (ref <5)

## 2021-04-03 LAB — LIPASE, BLOOD: Lipase: 36 U/L (ref 11–51)

## 2021-04-03 MED ORDER — IOHEXOL 350 MG/ML SOLN
75.0000 mL | Freq: Once | INTRAVENOUS | Status: AC | PRN
  Administered 2021-04-03: 75 mL via INTRAVENOUS

## 2021-04-03 NOTE — ED Provider Notes (Signed)
Emergency Medicine Provider Triage Evaluation Note  Tracey Avila , a 35 y.o. female  was evaluated in triage.  Pt complains of intermittent chest pain for the last 2-3 weeks.  No specific aggravating or alleviating factors.  Does not occur with movement or palpation.  Reports pain often radiates from the left chest to her back.  She has had several episodes of left arm numbness, color change and coldness.  Patient has addressed with her primary care several times and was told that this is likely anxiety.  Given prescription for Ativan 0.5 mg tablets.  Patient is not a smoker.  No recent travel, no leg swelling, no birth control, no history of lupus.  No previous history of DVT/PE.  Patient also reports that she obtained a Fitbit and has been monitoring her heart rate.  Reports often when she has this pain her heart rate drops to between 47 and 57.  She reports this has never been the case for her and reports she is not athletic.  Patient reports tonight pain became severe and her left arm went numb again.  This prompted her visit to the emergency department.  It has improved at this time.  Review of Systems  Positive: Chest pain, shortness of breath, left arm pain and numbness Negative: Nausea, vomiting, syncope  Physical Exam  BP (!) 144/81 (BP Location: Right Arm)   Pulse (!) 57   Temp 98.8 F (37.1 C) (Oral)   Resp 18   LMP 03/09/2021   SpO2 100%  Gen:   Awake, no distress   Resp:  Normal effort  MSK:   Moves extremities without difficulty, strength 5/5 in the BUE Other:  Bradycardia at triage  Medical Decision Making  Medically screening exam initiated at 2:58 AM.  Appropriate orders placed.  Tracey Avila was informed that the remainder of the evaluation will be completed by another provider, this initial triage assessment does not replace that evaluation, and the importance of remaining in the ED until their evaluation is complete.  Chest pain, bradycardia, left arm numbness.    Tracey Avila, Gwenlyn Perking 04/03/21 0301    Veryl Speak, MD 04/03/21 [phone]

## 2021-04-03 NOTE — Discharge Instructions (Signed)
The testing today does not show any serious problems.  We checked your heart, blood vessels, and blood work.  Low heart rates without passing out are usually not serious.  Consider following up with the cardiology service we are referring you to.  They can do more extensive testing for low heart rate problems, or other disorders that could cause your discomfort.  Your pain in the chest could be related to gastritis or heartburn.  You could consider taking Pepcid or Tagamet twice a day for couple weeks to see if that helps.  Down the road if no particular abnormalities are found, you may consider seeing a neurologist or psychiatrist for further evaluation.  Your primary care physician could help you with that.

## 2021-04-03 NOTE — ED Provider Notes (Signed)
[facility] EMERGENCY DEPARTMENT Provider Note   CSN: [id number] Arrival date & time: 04/03/21  0046     History Chief Complaint  Patient presents with   Chest Pain    Tracey Avila is a 35 y.o. female.  HPI She presents for ongoing chest pain, rating to left arm, felt as a burning sensation.  Pain also radiates to her mid back.  Symptoms intermittent for the last 3 weeks.  She is also concerned that her heart rate has been low, checking it with a Fitbit it has been in the mid to high 50s.  She states both of her parents have heart rates like this.  She has discussed this with her PCP who ordered thyroid panel, which was normal.  She has history of anxiety, not currently taking medications or seeing a therapist.  She is frustrated that her primary care doctor will not address the symptoms any further.  She does not have any symptoms of syncope or near syncope.  Sometimes she has some numbness in her left arm.  She denies headache, neck pain or back pain.  She is not having any trouble walking.  She currently works "in [employer]."  She does not smoke cigarettes or take drugs.  There are no other known active modifying factors.    History reviewed. No pertinent past medical history.  There are no problems to display for this patient.   Past Surgical History:  Procedure Laterality Date   TONSILLECTOMY     WISDOM TOOTH EXTRACTION       OB History   No obstetric history on file.     No family history on file.  Social History   Tobacco Use   Smoking status: Never   Smokeless tobacco: Never  Substance Use Topics   Alcohol use: Yes    Comment: socially   Drug use: No    Home Medications Prior to Admission medications   Medication Sig Start Date End Date Taking? Authorizing Provider  amoxicillin (AMOXIL) 500 MG capsule Take 1 capsule (500 mg total) by mouth 3 (three) times daily. 12/17/14   Billy Fischer, MD  benzonatate (TESSALON) 100 MG capsule Take 1  capsule (100 mg total) by mouth every 8 (eight) hours. 09/12/16   Ward, Ozella Almond, PA-C  clindamycin (CLEOCIN) 150 MG capsule Take 1 capsule (150 mg total) by mouth 4 (four) times daily. 07/31/15   Billy Fischer, MD  HYDROcodone-acetaminophen (NORCO/VICODIN) 5-325 MG tablet Take 1 tablet by mouth every 6 (six) hours as needed. 07/31/15   Billy Fischer, MD  ibuprofen (ADVIL,MOTRIN) 400 MG tablet Take 400 mg by mouth every 6 (six) hours as needed for fever.    [provider]  ondansetron (ZOFRAN) 4 MG tablet Take 1 tablet (4 mg total) by mouth every 6 (six) hours. 06/06/15   Konrad Felix, PA    Allergies    Patient has no known allergies.  Review of Systems   Review of Systems  All other systems reviewed and are negative.  Physical Exam Updated Vital Signs BP 121/84   Pulse 63   Temp 98.8 F (37.1 C) (Oral)   Resp 16   LMP 03/09/2021   SpO2 99%   Physical Exam Vitals and nursing note reviewed.  Constitutional:      General: She is not in acute distress.    Appearance: She is well-developed. She is not ill-appearing, toxic-appearing or diaphoretic.  HENT:     Head: Normocephalic and  atraumatic.     Right Ear: External ear normal.     Left Ear: External ear normal.     Nose: No congestion or rhinorrhea.  Eyes:     Conjunctiva/sclera: Conjunctivae normal.     Pupils: Pupils are equal, round, and reactive to light.  Neck:     Trachea: Phonation normal.  Cardiovascular:     Rate and Rhythm: Normal rate and regular rhythm.     Heart sounds: Normal heart sounds.  Pulmonary:     Effort: Pulmonary effort is normal.     Breath sounds: Normal breath sounds. No stridor.  Abdominal:     Palpations: Abdomen is soft.     Tenderness: There is no abdominal tenderness.  Musculoskeletal:        General: Normal range of motion.     Cervical back: Normal range of motion and neck supple.  Skin:    General: Skin is warm and dry.  Neurological:     Mental Status: She is  alert and oriented to person, place, and time.     Cranial Nerves: No cranial nerve deficit.     Sensory: No sensory deficit.     Motor: No abnormal muscle tone.     Coordination: Coordination normal.     Comments: No dysarthria or aphasia.  Psychiatric:        Mood and Affect: Mood normal.        Behavior: Behavior normal.        Thought Content: Thought content normal.        Judgment: Judgment normal.    ED Results / Procedures / Treatments   Labs (all labs ordered are listed, but only abnormal results are displayed) Labs Reviewed  CBC WITH DIFFERENTIAL/PLATELET - Abnormal; Notable for the following components:      Result Value   WBC 14.8 (*)    Neutro Abs 9.6 (*)    All other components within normal limits  COMPREHENSIVE METABOLIC PANEL - Abnormal; Notable for the following components:   Glucose, Bld 111 (*)    AST 14 (*)    All other components within normal limits  LIPASE, BLOOD  I-STAT BETA HCG BLOOD, ED ([facility], [facility], [facility] ONLY)  TROPONIN I (HIGH SENSITIVITY)  TROPONIN I (HIGH SENSITIVITY)    EKG EKG Interpretation  Date/Time:  Monday April 03 2021 02:11:14 EDT Ventricular Rate:  61 PR Interval:  122 QRS Duration: 92 QT Interval:  406 QTC Calculation: 408 R Axis:   74 Text Interpretation: Normal sinus rhythm Nonspecific T wave abnormality Abnormal ECG since last tracing no significant change Confirmed by Daleen Bo [phone]) on 04/03/2021 8:45:04 AM  Radiology DG Chest 2 View  Result Date: 04/03/2021 CLINICAL DATA:  Chest pain EXAM: CHEST - 2 VIEW COMPARISON:  09/12/2016 FINDINGS: The heart size and mediastinal contours are within normal limits. Both lungs are clear. The visualized skeletal structures are unremarkable. IMPRESSION: Normal study. Electronically Signed   By: Rolm Baptise M.D.   On: 04/03/2021 03:33   CT Angio Chest/Abd/Pel for Dissection W and/or Wo Contrast  Result Date: 04/03/2021 CLINICAL DATA:  35 year old female with chest and back pain.  EXAM: CT ANGIOGRAPHY CHEST, ABDOMEN AND PELVIS TECHNIQUE: Non-contrast CT of the chest was initially obtained. Multidetector CT imaging through the chest, abdomen and pelvis was performed using the standard protocol during bolus administration of intravenous contrast. Multiplanar reconstructed images and MIPs were obtained and reviewed to evaluate the vascular anatomy. CONTRAST:  12mL OMNIPAQUE IOHEXOL 350 MG/ML SOLN COMPARISON:  Chest radiographs 0314 hours today. FINDINGS: CTA CHEST FINDINGS Cardiovascular: Normal thoracic aorta. No cardiomegaly or pericardial effusion. Central pulmonary arteries appear to be patent. Normal proximal great vessels. Mediastinum/Nodes: Negative. No mediastinal mass or lymphadenopathy. Lungs/Pleura: Major airways are patent. Lung volumes are normal and both lungs appear clear. No pneumothorax or pleural effusion. Musculoskeletal: Negative. Review of the MIP images confirms the above findings. CTA ABDOMEN AND PELVIS FINDINGS VASCULAR Normal abdominal aorta and major aortic branches. Bilateral iliac and visible proximal femoral arteries also patent and normal. Review of the MIP images confirms the above findings. NON-VASCULAR Hepatobiliary: Negative liver.  Contracted and negative gallbladder. Pancreas: Negative. Spleen: Negative, physiologic early splenic enhancement. Adrenals/Urinary Tract: Negative adrenal glands. Kidneys are symmetric and enhance normally. Negative ureters and bladder. Pelvic phleboliths but no convincing urinary calculus. Stomach/Bowel: Negative. Normal appendix on series 7, image 222. No dilated bowel. Negative stomach and duodenum. No free air, free fluid or mesenteric inflammation. Lymphatic: No lymphadenopathy. Reproductive: Oval up to 7.9 cm diameter predominantly fatty mass arising from the right adnexa projecting into the midline pelvic inlet. Macroscopic fat, soft tissue, and also a small volume of calcified density (series 7, image 251) in keeping with  mature cystic teratoma (ovarian dermoid cyst). Uterus and ovaries otherwise negative. Other: No pelvic free fluid. Musculoskeletal: Negative. Review of the MIP images confirms the above findings. IMPRESSION: 1. 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 Recommend GYN surgery follow-up. 2. Otherwise normal CTA chest, abdomen, and pelvis. Electronically Signed   By: Genevie Ann M.D.   On: 04/03/2021 06:17    Procedures Procedures   Medications Ordered in ED Medications  iohexol (OMNIPAQUE) 350 MG/ML injection 75 mL (75 mLs Intravenous Contrast Given 04/03/21 0548)    ED Course  I have reviewed the triage vital signs and the nursing notes.  Pertinent labs & imaging results that were available during my care of the patient were reviewed by me and considered in my medical decision making (see chart for details).    MDM Rules/Calculators/A&P                            Patient Vitals for the past 24 hrs:  BP Temp Temp src Pulse Resp SpO2  04/03/21 0731 121/84 -- -- 63 16 99 %  04/03/21 0519 (!) 125/101 -- -- (!) 56 16 99 %  04/03/21 0216 (!) 144/81 98.8 F (37.1 C) Oral (!) 57 18 100 %    8:50 AM Reevaluation with update and discussion. After initial assessment and treatment, an updated evaluation reveals no change in clinical status, findings discussed with the patient and all questions were answered. Daleen Bo   Medical Decision Making:  This patient is presenting for evaluation of chest and arm pain, which does require a range of treatment options, and is a complaint that involves a moderate risk of morbidity and mortality. The differential diagnoses include ACS, aortic dissection, anxiety, malaise. I decided to review old records, and in summary healthy 35 year old female presenting with ongoing symptoms, without significant red flags.  She has a history of anxiety/panic attack, but feels like the symptoms are different.  I did not require additional  historical information from anyone.  Clinical Laboratory Tests Ordered, included CBC, Metabolic panel, Pregnancy test, and troponin, lipase . Review indicates normal except mild elevation of [place] count and glucose. Radiologic Tests Ordered, included chest x-ray, CT angiogram chest abdomen pelvis.  I independently  Visualized: Radiograph images, which show no acute abnormalities    Critical Interventions-clinical evaluation, laboratory testing, radiography, observation and reassessment  After These Interventions, the Patient was reevaluated and was found with nonspecific symptoms and reassuring evaluation.  No clear evidence for acute central nervous system, cardiac, pulmonary or peripheral nervous system abnormalities.  Possible peptic cause, but unable to rule that out.  She may benefit from more advanced evaluation for cardiac dysfunction by cardiology service to include echo, prolonged cardiac monitoring or perhaps referral to neurology/psychiatry.  CRITICAL CARE-no Performed by: Daleen Bo  Nursing Notes Reviewed/ Care Coordinated Applicable Imaging Reviewed Interpretation of Laboratory Data incorporated into ED treatment  The patient appears reasonably screened and/or stabilized for discharge and I doubt any other medical condition or other [facility] requiring further screening, evaluation, or treatment in the ED at this time prior to discharge.  Plan: Home Medications-OTC as needed; Home Treatments-regular diet and activity; return here if the recommended treatment, does not improve the symptoms; Recommended follow up-cardiology follow-up for further evaluation.  PCP, as needed     Final Clinical Impression(s) / ED Diagnoses Final diagnoses:  Bradycardia  Nonspecific chest pain    Rx / DC Orders ED Discharge Orders     None        Daleen Bo, MD 04/03/21 [phone]

## 2021-04-03 NOTE — ED Triage Notes (Signed)
Left chest pain, seems to get worse when she notices a drop in her HR to 40s per her fit bit.  Pt took .5mg  ativan b/c PCP believed it was anxiety.

## 2021-04-07 ENCOUNTER — Ambulatory Visit (INDEPENDENT_AMBULATORY_CARE_PROVIDER_SITE_OTHER): Payer: BC Managed Care – PPO | Admitting: Orthopaedic Surgery

## 2021-04-07 ENCOUNTER — Other Ambulatory Visit: Payer: Self-pay

## 2021-04-07 DIAGNOSIS — M7582 Other shoulder lesions, left shoulder: Secondary | ICD-10-CM | POA: Diagnosis not present

## 2021-04-07 NOTE — Progress Notes (Signed)
Chief Complaint: right shoulder pain   History of Present Illness:   04/07/2021: Left shoulder pain.  She presents today status post right shoulder injection for early adhesive capsulitis which is doing significantly better.  She states that the left shoulder is hurting in the past 4 to 5 days after she was lifting boxes at work.  This is only with overhead motion.  She denies any weakness or loss of motion.  She is quite anxious at this time about any types of medications or injections as she was recently diagnosed with bradycardia after a visit to the emergency room  Tracey Avila is a 35 y.o. female ambidextrous female with right shoulder pain after a tetanus shot performed on March 19, 2021.  At that time she is experienced sharp pain throughout the glenohumeral joint.  She has pain with any type of range of motion.  She feels a sharp stabbing type pain when she goes to reach for something.  She currently works in a [employer].  She denies any history of thyroid or diabetes.  She has been taking naproxen which helps somewhat.    Surgical History:   None  PMH/PSH/Family History/Social History/Meds/Allergies:   No past medical history on file. Past Surgical History:  Procedure Laterality Date   TONSILLECTOMY     WISDOM TOOTH EXTRACTION     Social History   Socioeconomic History   Marital status: Single    Spouse name: Not on file   Number of children: Not on file   Years of education: Not on file   Highest education level: Not on file  Occupational History   Not on file  Tobacco Use   Smoking status: Never   Smokeless tobacco: Never  Substance and Sexual Activity   Alcohol use: Yes    Comment: socially   Drug use: No   Sexual activity: Not on file  Other Topics Concern   Not on file  Social History Narrative   Not on file   Social Determinants of Health   Financial Resource Strain: Not on file  Food Insecurity: Not on file   Transportation Needs: Not on file  Physical Activity: Not on file  Stress: Not on file  Social Connections: Not on file   No family history on file. No Known Allergies Current Outpatient Medications  Medication Sig Dispense Refill   amoxicillin (AMOXIL) 500 MG capsule Take 1 capsule (500 mg total) by mouth 3 (three) times daily. 30 capsule 0   benzonatate (TESSALON) 100 MG capsule Take 1 capsule (100 mg total) by mouth every 8 (eight) hours. 21 capsule 0   clindamycin (CLEOCIN) 150 MG capsule Take 1 capsule (150 mg total) by mouth 4 (four) times daily. 28 capsule 0   HYDROcodone-acetaminophen (NORCO/VICODIN) 5-325 MG tablet Take 1 tablet by mouth every 6 (six) hours as needed. 6 tablet 0   ibuprofen (ADVIL,MOTRIN) 400 MG tablet Take 400 mg by mouth every 6 (six) hours as needed for fever.     ondansetron (ZOFRAN) 4 MG tablet Take 1 tablet (4 mg total) by mouth every 6 (six) hours. 12 tablet 0   No current facility-administered medications for this visit.   No results found.  Review of Systems:   A ROS was performed including pertinent positives and negatives as documented in the HPI.  Physical  Exam :   Constitutional: NAD and appears stated age Neurological: Alert and oriented Psych: Appropriate affect and cooperative Last menstrual period 03/09/2021.   Comprehensive Musculoskeletal Exam:    Musculoskeletal Exam    Inspection Right Left  Skin No atrophy or winging No atrophy or winging  Palpation    Tenderness Glenohumeral None  Range of Motion    Flexion (passive) 170 170  Flexion (active) 170 170  Abduction 170 170  ER at the side 70 70  Can reach behind back to T12 T12  Strength     5 out of 5 5 out of 5  Special Tests    Pseudoparalytic No No  Neurologic    Fires PIN, radial, median, ulnar, musculocutaneous, axillary, suprascapular, long thoracic, and spinal accessory innervated muscles. No abnormal sensibility  Vascular/Lymphatic    Radial Pulse 2+ 2+   Cervical Exam    Patient has symmetric cervical range of motion with negative Avila's test.  Special Test: Positive Neer and [place]kin impingement     Imaging:    I personally reviewed and interpreted the radiographs.   Assessment:   35 year old female with right shoulder pain consistent with adhesive capsulitis.  This has resolved after a steroid injection.  At this time she has developed some left shoulder rotator cuff tendinitis after lifting heavy boxes.  I advised that ultimately this is self-limiting.  She would like to avoid injections or medications now until her bradycardia is figured out.  I have advised that she may begin a left shoulder band program with bands that she has at home she would like to try this first.  Plan :    -She will follow-up as necessary       I personally saw and evaluated the patient, and participated in the management and treatment plan.  Vanetta Mulders, MD Attending Physician, Orthopedic Surgery  This document was dictated using Dragon voice recognition software. A reasonable attempt at proof reading has been made to minimize errors.

## 2021-04-24 ENCOUNTER — Encounter: Payer: Self-pay | Admitting: Cardiology

## 2021-04-24 ENCOUNTER — Ambulatory Visit: Payer: BC Managed Care – PPO | Admitting: Cardiology

## 2021-04-24 ENCOUNTER — Other Ambulatory Visit: Payer: Self-pay

## 2021-04-24 VITALS — BP 122/80 | HR 70 | Temp 97.8°F | Resp 16 | Ht 66.0 in | Wt 178.0 lb

## 2021-04-24 DIAGNOSIS — R072 Precordial pain: Secondary | ICD-10-CM

## 2021-04-24 DIAGNOSIS — R9431 Abnormal electrocardiogram [ECG] [EKG]: Secondary | ICD-10-CM

## 2021-04-24 DIAGNOSIS — G4701 Insomnia due to medical condition: Secondary | ICD-10-CM

## 2021-04-24 DIAGNOSIS — M940 Chondrocostal junction syndrome [Tietze]: Secondary | ICD-10-CM

## 2021-04-24 MED ORDER — TRAZODONE HCL 50 MG PO TABS: 50.0000 mg | ORAL_TABLET | Freq: Every evening | ORAL | 0 refills | Status: DC | PRN

## 2021-04-24 MED ORDER — OMEPRAZOLE 20 MG PO CPDR: 20.0000 mg | DELAYED_RELEASE_CAPSULE | Freq: Every day | ORAL | 2 refills | Status: DC

## 2021-04-24 MED ORDER — INDOMETHACIN 25 MG PO CAPS: 25.0000 mg | ORAL_CAPSULE | Freq: Three times a day (TID) | ORAL | 0 refills | Status: DC

## 2021-04-24 NOTE — Progress Notes (Signed)
Primary Physician/Referring:  Malena Peer, MD  Patient ID: Tracey Avila, female    DOB: Jun 27, 1985, 35 y.o.   MRN: [id number]  Chief Complaint  Patient presents with   Chest Pain   New Patient (Initial Visit)   HPI:    Tracey Avila  is a 35 y.o. 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  Patient states that for the past 3 months she has had constant chest discomfort in the left upper part of the chest.  Chest pain is nonradiating.  It is there for several hours sometimes for several days continuously.  No definite relationship to exertional activity or rest or position of her body or deep breath.  She also complains of back pain below the left scapular region, feels the back pain radiates to her left arm in the form of tingling and numbness.  Not necessarily associated with the chest pain and they can come independently.  She denies any dizziness or syncope, no hemoptysis.  Recently she has been trying to lose weight.  Past Medical History:  Diagnosis Date   Anxiety    Burning chest pain    Chest pain    Left arm numbness    Left arm pain    Mid back pain    Syncope    Past Surgical History:  Procedure Laterality Date   TONSILLECTOMY     WISDOM TOOTH EXTRACTION     Family History  Problem Relation Age of Onset   Hypertension Father    Hyperlipidemia Father     Social History   Tobacco Use   Smoking status: Never   Smokeless tobacco: Never  Substance Use Topics   Alcohol use: Yes    Comment: socially   Marital Status: Single  ROS  Review of Systems  Cardiovascular:  Positive for chest pain. Negative for dyspnea on exertion and leg swelling.   Gastrointestinal:  Negative for melena.  Psychiatric/Behavioral:  The patient has insomnia and is nervous/anxious.   Objective  Blood pressure 122/80, pulse 70, temperature 97.8 F (36.6 C), resp. rate 16, height 5\' 6"  (1.676 m), weight 178 lb (80.7 kg), SpO2 98 %. Body mass index is 28.73 kg/m.  Vitals with BMI 04/24/2021 04/24/2021 04/03/2021  Height - 5\' 6"  -  Weight - 178 lbs -  BMI - 51.88 -  Systolic 416 606 301  Diastolic 80 78 601  Pulse - 70 58    Physical Exam Neck:     Vascular: No carotid bruit or JVD.  Cardiovascular:     Rate and Rhythm: Normal rate and regular rhythm.     Pulses: Intact distal pulses.     Heart sounds: Normal heart sounds. No murmur heard.   No gallop.  Pulmonary:     Effort: Pulmonary effort is normal.     Breath sounds: Normal breath sounds.  Chest:     Chest wall: Tenderness (left 3 and 4 costochondral junction) present.  Abdominal:     General: Bowel sounds are normal.     Palpations: Abdomen is soft.  Musculoskeletal:        General: No swelling.     Laboratory examination:   Recent Labs    04/03/21 0301  NA 135  K 3.6  CL 102  CO2 23  GLUCOSE 111*  BUN 8  CREATININE 0.67  CALCIUM 9.4  GFRNONAA >60   CrCl cannot be calculated (Patient's most recent lab result is older than the maximum 21 days allowed.).  CMP Latest Ref Rng & Units 04/03/2021  Glucose 70 - 99 mg/dL 111(H)  BUN 6 - 20 mg/dL 8  Creatinine 0.44 - 1.00 mg/dL 0.67  Sodium 135 - 145 mmol/L 135  Potassium 3.5 - 5.1 mmol/L 3.6  Chloride 98 - 111 mmol/L 102  CO2 22 - 32 mmol/L 23  Calcium 8.9 - 10.3 mg/dL 9.4  Total Protein 6.5 - 8.1 g/dL 7.2  Total Bilirubin 0.3 - 1.2 mg/dL 0.7  Alkaline Phos 38 - 126 U/L 49  AST 15 - 41 U/L 14(L)  ALT 0 - 44 U/L 15   CBC Latest Ref Rng & Units 04/03/2021  WBC 4.0 - 10.5 K/uL 14.8(H)  Hemoglobin 12.0 - 15.0 g/dL 14.2  Hematocrit 36.0 - 46.0 % 42.0  Platelets 150 - 400 K/uL 260    External labs:   Labs  03/07/2021:  Total cholesterol 177, triglycerides 128, HDL 40, LDL 113.  Non-HDL cholesterol 137.  TSH 1.38, normal.  Medications and allergies  No Known Allergies   Medication prior to this encounter:   Outpatient Medications Prior to Visit  Medication Sig Dispense Refill   albuterol (VENTOLIN HFA) 108 (90 Base) MCG/ACT inhaler Inhale 2 puffs into the lungs every 4 (four) hours as needed.     EPINEPHrine 0.3 mg/0.3 mL IJ SOAJ injection Inject into the muscle as directed.     LORazepam (ATIVAN) 0.5 MG tablet Take 1 mg by mouth 3 (three) times daily as needed.     amoxicillin (AMOXIL) 500 MG capsule Take 1 capsule (500 mg total) by mouth 3 (three) times daily. 30 capsule 0   benzonatate (TESSALON) 100 MG capsule Take 1 capsule (100 mg total) by mouth every 8 (eight) hours. 21 capsule 0   clindamycin (CLEOCIN) 150 MG capsule Take 1 capsule (150 mg total) by mouth 4 (four) times daily. 28 capsule 0   HYDROcodone-acetaminophen (NORCO/VICODIN) 5-325 MG tablet Take 1 tablet by mouth every 6 (six) hours as needed. 6 tablet 0   ibuprofen (ADVIL,MOTRIN) 400 MG tablet Take 400 mg by mouth every 6 (six) hours as needed for fever.     ondansetron (ZOFRAN) 4 MG tablet Take 1 tablet (4 mg total) by mouth every 6 (six) hours. 12 tablet 0   No facility-administered medications prior to visit.     Medication list after today's encounter   Current Outpatient Medications  Medication Instructions   albuterol (VENTOLIN HFA) 108 (90 Base) MCG/ACT inhaler 2 puffs, Inhalation, Every 4 hours PRN   EPINEPHrine 0.3 mg/0.3 mL IJ SOAJ injection Intramuscular, As directed   indomethacin (INDOCIN) 25 mg, Oral, 3 times daily with meals   LORazepam (ATIVAN) 1 mg, Oral, 3 times daily PRN   omeprazole (PRILOSEC) 20 mg, Oral, Daily before breakfast   traZODone (DESYREL) 50 mg, Oral, At bedtime PRN    Radiology:   CT angio of the chest, abdomen and pelvis 04/03/2021: Cardiovascular: Normal thoracic aorta. No  cardiomegaly or pericardial effusion. Central pulmonary arteries appear to be patent. Normal proximal great vessels.  Mediastinum/Nodes: Negative. No mediastinal mass or lymphadenopathy.  Lungs/Pleura: Major airways are patent. Lung volumes are normal and both lungs appear clear. No pneumothorax or pleural effusion. 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 Recommend GYN surgery follow-up.  Cardiac Studies:   NA  EKG:   EKG 04/24/2021: Normal sinus rhythm at rate of 84 bpm, left atrial enlargement, normal axis.  Incomplete right bundle branch block.  Diffuse 2 mm ST depression with T wave inversion, cannot exclude inferior and anterolateral ischemia.  Normal QT interval.  Abnormal EKG. Compared to 04/12/2021 and 04/03/2021 EKG, diffuse ST-T changes are new compared to focal T wave inversion in V1 to V3.    Assessment     ICD-10-CM   1. Precordial pain  R07.2 EKG 12-Lead    PCV ECHOCARDIOGRAM COMPLETE    Troponin T    omeprazole (PRILOSEC) 20 MG capsule    2. Costochondritis  M94.0     3. Abnormal EKG  R94.31 PCV ECHOCARDIOGRAM COMPLETE    4. Insomnia due to medical condition  G47.01 traZODone (DESYREL) 50 MG tablet       Medications Discontinued During This Encounter  Medication Reason   amoxicillin (AMOXIL) 500 MG capsule Error   benzonatate (TESSALON) 100 MG capsule Error   clindamycin (CLEOCIN) 150 MG capsule Error   HYDROcodone-acetaminophen (NORCO/VICODIN) 5-325 MG tablet Error   ibuprofen (ADVIL,MOTRIN) 400 MG tablet Error   ondansetron (ZOFRAN) 4 MG tablet Error    Meds ordered this encounter  Medications   indomethacin (INDOCIN) 25 MG capsule    Sig: Take 1 capsule (25 mg total) by mouth 3 (three) times daily with meals.    Dispense:  90 capsule    Refill:  0   omeprazole (PRILOSEC) 20 MG capsule    Sig: Take 1 capsule (20 mg total) by mouth daily before breakfast.    Dispense:  30 capsule    Refill:  2   traZODone (DESYREL)  50 MG tablet    Sig: Take 1 tablet (50 mg total) by mouth at bedtime as needed for sleep.    Dispense:  30 tablet    Refill:  0   Orders Placed This Encounter  Procedures   Troponin T   EKG 12-Lead   PCV ECHOCARDIOGRAM COMPLETE    Standing Status:   Future    Standing Expiration Date:   04/24/2022   Recommendations:   Tracey Avila is a 35 y.o. 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  Patient states that for the past 3 months she has had constant chest discomfort in the left upper part of the chest.  Chest pain is nonradiating.  She also has focal tenderness of the left third and fourth intercostal space.  Back pain in the left below scapula and tingling and numbness in the left arm is so nonspecific and not necessarily associated with chest pain.  Her EKG is markedly abnormal today.  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  Will obtain an echocardiogram to evaluate structural heart disease.  I will place her  on indomethacin 25 mg p.o. 3 times daily along with a PPI.  Over the past 3 months due to extreme stress related to her medical issues, she has not been able to sleep well, Desyrel 50 mg prescribed.  Would like to see her back in 3 weeks for follow-up.  I will also obtain serum troponins today to see if there has been any changes since ED evaluation.  If her EKG changes resolved,  we can assume that it is probably related to viral pericarditis, however if I see any structural abnormality on the echo or EKG changes persist, we could consider coronary CTA or cardiac MR.  I will make further recommendations.    Adrian Prows, MD, [facility] 04/24/2021, 10:40 AM Office: [phone]

## 2021-04-25 ENCOUNTER — Ambulatory Visit: Payer: BC Managed Care – PPO

## 2021-04-25 DIAGNOSIS — R072 Precordial pain: Secondary | ICD-10-CM

## 2021-04-25 DIAGNOSIS — R9431 Abnormal electrocardiogram [ECG] [EKG]: Secondary | ICD-10-CM

## 2021-04-26 ENCOUNTER — Telehealth: Payer: Self-pay

## 2021-04-26 NOTE — Telephone Encounter (Signed)
Pedialyte and electrolytes with help with the feelings.

## 2021-04-26 NOTE — Telephone Encounter (Signed)
Pt called and stated that she noticed over the last few days when she is sitting her heart rate is between 60-70 bpm. When she stands up and begins to walk her HR goes up to around 113. She does have brief episodes of dizziness when she stands up but it usually goes away when she sits back down. Pt is concerned and unsure of what she should do. Please advise.

## 2021-04-26 NOTE — Telephone Encounter (Signed)
From pt

## 2021-04-27 ENCOUNTER — Encounter: Payer: Self-pay | Admitting: Cardiology

## 2021-04-27 ENCOUNTER — Other Ambulatory Visit: Payer: Self-pay | Admitting: Cardiology

## 2021-04-27 LAB — TROPONIN T: Troponin T (Highly Sensitive): <6 ng/L (ref 0–14)

## 2021-04-27 NOTE — Telephone Encounter (Signed)
Called and spoke to pt, she voiced understanding. She stated that she woke up in the middle of the night feeling very nauseous and she believes this is because of the indocin 25 mg. Pt said she would like for you to send a message through Mychart because she is not always able to answer the phone.

## 2021-04-27 NOTE — Telephone Encounter (Signed)
From patient.

## 2021-04-27 NOTE — Progress Notes (Signed)
   Medications Discontinued During This Encounter  Medication Reason   indomethacin (INDOCIN) 25 MG capsule Side effect (s)    Patient sent via MyChart message about persistent chest pain, severe GERD with indomethacin.  Serum troponin is negative, no change in chest pain, hence do not suspect she has pericarditis, will discontinue this.

## 2021-04-28 NOTE — Telephone Encounter (Signed)
From pt

## 2021-05-05 ENCOUNTER — Encounter (HOSPITAL_COMMUNITY): Payer: Self-pay | Admitting: Radiology

## 2021-05-12 ENCOUNTER — Ambulatory Visit: Payer: BC Managed Care – PPO | Admitting: Cardiovascular Disease

## 2021-05-12 ENCOUNTER — Ambulatory Visit: Payer: Self-pay | Admitting: *Deleted

## 2021-05-12 NOTE — Telephone Encounter (Signed)
Reviewed incidental findings information on CT report from 04/03/21. Patient was aware of findings and has a follow-up appointment with OBGYN on 05/15/21. No further questions.    Reason for Disposition  Health Information question, no triage required and triager able to answer question  Answer Assessment - Initial Assessment Questions 1. REASON FOR CALL or QUESTION: "What is your reason for calling today?" or "How can I best help you?" or "What question do you have that I can help answer?"     Review of incidental findings letter.  Protocols used: Information Only Call - No Triage-A-AH

## 2021-05-16 ENCOUNTER — Other Ambulatory Visit: Payer: Self-pay | Admitting: Cardiology

## 2021-05-16 DIAGNOSIS — G4701 Insomnia due to medical condition: Secondary | ICD-10-CM

## 2021-05-16 NOTE — Telephone Encounter (Signed)
Okay to refill? 

## 2021-05-19 ENCOUNTER — Ambulatory Visit: Payer: BC Managed Care – PPO | Admitting: Cardiology

## 2021-05-19 ENCOUNTER — Other Ambulatory Visit: Payer: Self-pay

## 2021-05-19 ENCOUNTER — Encounter: Payer: Self-pay | Admitting: Cardiology

## 2021-05-19 VITALS — BP 134/89 | HR 73 | Temp 97.5°F | Resp 16 | Ht 66.0 in | Wt 179.0 lb

## 2021-05-19 DIAGNOSIS — Z01810 Encounter for preprocedural cardiovascular examination: Secondary | ICD-10-CM

## 2021-05-19 DIAGNOSIS — R030 Elevated blood-pressure reading, without diagnosis of hypertension: Secondary | ICD-10-CM

## 2021-05-19 DIAGNOSIS — M940 Chondrocostal junction syndrome [Tietze]: Secondary | ICD-10-CM

## 2021-05-19 NOTE — Progress Notes (Signed)
Primary Physician/Referring:  Malena Peer, MD  Patient ID: Tracey Avila, female    DOB: [birth year], 35 y.o.   MRN: [id number]  Chief Complaint  Patient presents with   Follow-up    3 weeks   Chest Pain   HPI:    Tracey Avila  is a 35 y.o. 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  I had seen her a month ago, felt that her chest pain was costochondritis but she had markedly abnormal EKG with T wave inversion in the inferior leads and anterolateral leads, changes were new compared to emergency room EKG which was normal.  Patient states that her chest pain symptoms are improved significantly.  Patient returned back to work without any restrictions.  Denies any dyspnea.  She could not tolerate indomethacin.  Due to GI side effects, she is presently on a PPI.  She has noticed her blood pressure to be elevated.  No leg edema, no PND or orthopnea.  Past Medical History:  Diagnosis Date   Anxiety    Burning chest pain    Chest pain    Left arm numbness    Left arm pain    Mid back pain    Syncope    Past Surgical History:  Procedure Laterality Date   TONSILLECTOMY     WISDOM TOOTH EXTRACTION     Family History  Problem Relation Age of Onset   Hypertension Father    Hyperlipidemia Father     Social History   Tobacco Use   Smoking status: Never   Smokeless tobacco: Never  Substance Use Topics   Alcohol use: Yes    Comment: socially   Marital Status: Single  ROS  Review of Systems  Cardiovascular:  Positive for chest pain. Negative for dyspnea on exertion and leg swelling.  Gastrointestinal:  Negative for melena.  Psychiatric/Behavioral:  The patient has insomnia and is nervous/anxious.   Objective   Blood pressure 134/89, pulse 73, temperature (!) 97.5 F (36.4 C), resp. rate 16, height 5\' 6"  (1.676 m), weight 179 lb (81.2 kg), SpO2 96 %. Body mass index is 28.89 kg/m.  Vitals with BMI 05/19/2021 05/19/2021 04/24/2021  Height - 5\' 6"  -  Weight - 179 lbs -  BMI - 60.63 -  Systolic 016 010 932  Diastolic 89 80 80  Pulse 73 57 -    Physical Exam Neck:     Vascular: No carotid bruit or JVD.  Cardiovascular:     Rate and Rhythm: Normal rate and regular rhythm.     Pulses: Intact distal pulses.     Heart sounds: Normal heart sounds. No murmur heard.   No gallop.  Pulmonary:     Effort: Pulmonary effort is normal.     Breath sounds: Normal breath sounds.  Chest:     Chest wall: No tenderness.  Abdominal:     General: Bowel sounds are normal.     Palpations: Abdomen is soft.  Musculoskeletal:        General: No swelling.     Laboratory examination:   Recent Labs    04/03/21 0301  NA 135  K 3.6  CL 102  CO2 23  GLUCOSE 111*  BUN 8  CREATININE 0.67  CALCIUM 9.4  GFRNONAA >60   CrCl cannot be calculated (Patient's most recent lab result is older than the maximum 21 days allowed.).  CMP Latest Ref Rng & Units 04/03/2021  Glucose 70 - 99 mg/dL 111(H)  BUN 6 - 20 mg/dL 8  Creatinine 0.44 - 1.00 mg/dL 0.67  Sodium 135 - 145 mmol/L 135  Potassium 3.5 - 5.1 mmol/L 3.6  Chloride 98 - 111 mmol/L 102  CO2 22 - 32 mmol/L 23  Calcium 8.9 - 10.3 mg/dL 9.4  Total Protein 6.5 - 8.1 g/dL 7.2  Total Bilirubin 0.3 - 1.2 mg/dL 0.7  Alkaline Phos 38 - 126 U/L 49  AST 15 - 41 U/L 14(L)  ALT 0 - 44 U/L 15   CBC Latest Ref Rng & Units 04/03/2021  WBC 4.0 - 10.5 K/uL 14.8(H)  Hemoglobin 12.0 - 15.0 g/dL 14.2  Hematocrit 36.0 - 46.0 % 42.0  Platelets 150 - 400 K/uL 260    External labs:   Labs 03/07/2021:  Total cholesterol 177, triglycerides 128, HDL 40, LDL 113.  Non-HDL cholesterol 137.  TSH 1.38, normal.  Medications and allergies  No Known Allergies   Medication  prior to this encounter:   Outpatient Medications Prior to Visit  Medication Sig Dispense Refill   albuterol (VENTOLIN HFA) 108 (90 Base) MCG/ACT inhaler Inhale 2 puffs into the lungs every 4 (four) hours as needed.     EPINEPHrine 0.3 mg/0.3 mL IJ SOAJ injection Inject into the muscle as directed.     LORazepam (ATIVAN) 0.5 MG tablet Take 1 mg by mouth 3 (three) times daily as needed.     omeprazole (PRILOSEC) 20 MG capsule Take 1 capsule (20 mg total) by mouth daily before breakfast. 30 capsule 2   omeprazole (PRILOSEC) 20 MG capsule omeprazole 20 mg capsule,delayed release   1 capsule every day by oral route.     traZODone (DESYREL) 50 MG tablet TAKE 1 TABLET BY MOUTH AT BEDTIME AS NEEDED FOR SLEEP. 90 tablet 1   No facility-administered medications prior to visit.     Medication list after today's encounter   Current Outpatient Medications  Medication Instructions   albuterol (VENTOLIN HFA) 108 (90 Base) MCG/ACT inhaler 2 puffs, Inhalation, Every 4 hours PRN   EPINEPHrine 0.3 mg/0.3 mL IJ SOAJ injection Intramuscular, As directed   LORazepam (ATIVAN) 1 mg, Oral, 3 times daily PRN   omeprazole (PRILOSEC) 20 mg, Oral, Daily before breakfast    Radiology:   CT angio of the chest, abdomen and pelvis 04/03/2021: Cardiovascular: Normal thoracic aorta. No cardiomegaly or pericardial effusion. Central pulmonary arteries appear to be patent. Normal proximal great vessels.  Mediastinum/Nodes: Negative. No mediastinal mass or lymphadenopathy.  Lungs/Pleura: Major airways are patent. Lung volumes are normal and both lungs appear clear. No pneumothorax or pleural effusion. 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 Recommend GYN surgery follow-up.  Cardiac Studies:   PCV ECHOCARDIOGRAM COMPLETE 04/25/2021  Narrative Echocardiogram 04/25/2021: Normal LV systolic function with visual EF 60-65%. Left ventricle cavity is normal in size. Normal global wall  motion. Normal diastolic filling pattern, normal LAP. Mild tricuspid regurgitation. No prior study for comparison.     EKG:   EKG 05/19/2021: Normal sinus rhythm at a rate of 67 bpm, incomplete right bundle branch block.  No evidence of ischemia, normal EKG.  T wave inversion in V1 to V3, normal variant.  No significant change from 04/03/2021 however compared to 04/24/2021, diffuse inferior and anterolateral T wave inversion has  completely resolved.  EKG 04/24/2021: Normal sinus rhythm at rate of 84 bpm, left atrial enlargement, normal axis.  Incomplete right bundle branch block.  Diffuse 2 mm ST depression with T wave inversion, cannot exclude inferior and anterolateral ischemia.  Normal QT interval.  Abnormal EKG. Compared to 04/12/2021 and 04/03/2021 EKG, diffuse ST-T changes are new- Normal EKG.  Assessment     ICD-10-CM   1. Abnormal EKG  R94.31 EKG 12-Lead    2. Costochondritis  M94.0     3. Elevated BP without diagnosis of hypertension  R03.0       Medications Discontinued During This Encounter  Medication Reason   omeprazole (PRILOSEC) 20 MG capsule    traZODone (DESYREL) 50 MG tablet     No orders of the defined types were placed in this encounter.  Orders Placed This Encounter  Procedures   EKG 12-Lead    Recommendations:   Tracey Avila is a 35 y.o. 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  I had seen her a month ago, felt that her chest pain was costochondritis but she had markedly abnormal EKG with T wave inversion in the inferior leads and anterolateral leads, changes were new compared to emergency room EKG which was normal.  Today she has no tenderness.  Chest pain is essentially completely  resolved except occasionally sharp pains when she moves a certain way.  Serum troponins are negative and echocardiogram reveals no wall motion abnormality or pericardial effusion.  Suspect she probably had some form of mild myocarditis versus pericarditis.  Today the repeat EKG essentially is normal.  There is complete resolution of the T wave abnormality.  In the absence of elevated troponin, I still suspect she probably had mild pericarditis.  Blood pressure has been elevated over the past few days.  Advised her to decrease her salt intake and also to lose 10 pounds in weight and to keep track of the blood pressure, if diastolic blood pressure remains still elevated to >85 mmHg, she should go on therapy.  Otherwise stable from cardiac standpoint, I will see her back on a as needed basis.  She is scheduled for right oophorectomy, do not see any contraindication and can be done under low risk.  I will send my note to Dr. Gaetano Net.      Adrian Prows, MD, [facility] 05/19/2021, 9:46 AM Office: [phone]

## 2021-05-21 ENCOUNTER — Other Ambulatory Visit: Payer: Self-pay | Admitting: Cardiology

## 2021-05-27 ENCOUNTER — Encounter: Payer: Self-pay | Admitting: Cardiology

## 2021-05-29 NOTE — Telephone Encounter (Signed)
From pt

## 2021-05-30 ENCOUNTER — Other Ambulatory Visit: Payer: Self-pay

## 2021-05-30 ENCOUNTER — Encounter (HOSPITAL_BASED_OUTPATIENT_CLINIC_OR_DEPARTMENT_OTHER): Payer: Self-pay | Admitting: Obstetrics and Gynecology

## 2021-05-30 DIAGNOSIS — J45909 Unspecified asthma, uncomplicated: Secondary | ICD-10-CM | POA: Insufficient documentation

## 2021-05-30 NOTE — Telephone Encounter (Signed)
From patient.

## 2021-05-30 NOTE — Progress Notes (Signed)
Spoke w/ via phone for pre-op interview--- Curt Bears Lab needs dos----  CBC, HCG Serum and T&S.             Lab results------ COVID test -----patient states asymptomatic no test needed Arrive at ------- 1100 NPO after MN NO Solid Food.  Clear liquids from MN until---1000 Med rec completed Medications to take morning of surgery ----- Diabetic medication ----- Patient instructed no nail polish to be worn day of surgery Patient instructed to bring photo id and insurance card day of surgery Patient aware to have Driver (ride ) / caregiver    for 24 hours after surgery  Patient Special Instructions ----- Pre-Op special Istructions ----- Patient verbalized understanding of instructions that were given at this phone interview. Patient denies shortness of breath, chest pain, fever, cough at this phone interview. Anesthesia Review:  EQA:STMHDQ Chow, MD Cardiologist :Darol Destine 05/19/21 Note stated pt ok for surgery. Chest x-ray :03/2021 EKG :05/2021 Echo :04/2021 Stress test: Cardiac Cath :  Activity level:  Sleep Study/ CPAP : Fasting Blood Sugar :      / Checks Blood Sugar -- times a day:   Blood Thinner/ Instructions /Last Dose: ASA / Instructions/ Last Dose :

## 2021-06-01 NOTE — Progress Notes (Signed)
Pt called with medication updates. Pt may take lorazepam prn use bring albuterol inahler, omeprazole dos

## 2021-06-05 ENCOUNTER — Encounter (HOSPITAL_COMMUNITY): Payer: Self-pay | Admitting: Anesthesiology

## 2021-06-05 NOTE — Anesthesia Preprocedure Evaluation (Deleted)
Anesthesia Evaluation    Reviewed: Allergy & Precautions, Patient's Chart, lab work & pertinent test results  Airway        Dental   Pulmonary asthma ,           Cardiovascular Exercise Tolerance: Good      Neuro/Psych Anxiety negative neurological ROS     GI/Hepatic Neg liver ROS, GERD  ,  Endo/Other  negative endocrine ROS  Renal/GU negative Renal ROS     Musculoskeletal negative musculoskeletal ROS (+)   Abdominal   Peds  Hematology   Anesthesia Other Findings   Reproductive/Obstetrics                             Anesthesia Physical Anesthesia Plan  ASA: 2  Anesthesia Plan: General   Post-op Pain Management: Toradol IV (intra-op)   Induction: Intravenous  PONV Risk Score and Plan: 3 and Midazolam, Dexamethasone, Ondansetron and Treatment may vary due to age or medical condition  Airway Management Planned: Oral ETT  Additional Equipment: None  Intra-op Plan:   Post-operative Plan: Extubation in OR  Informed Consent:     Dental advisory given  Plan Discussed with: CRNA and Anesthesiologist  Anesthesia Plan Comments:         Anesthesia Quick Evaluation

## 2021-06-06 ENCOUNTER — Ambulatory Visit (HOSPITAL_BASED_OUTPATIENT_CLINIC_OR_DEPARTMENT_OTHER)
Admission: RE | Admit: 2021-06-06 | Payer: BC Managed Care – PPO | Source: Ambulatory Visit | Admitting: Obstetrics and Gynecology

## 2021-06-06 HISTORY — DX: Gastro-esophageal reflux disease without esophagitis: K21.9

## 2021-06-06 SURGERY — LAPAROSCOPY, DIAGNOSTIC
Anesthesia: Choice | Laterality: Right

## 2021-06-23 ENCOUNTER — Other Ambulatory Visit: Payer: Self-pay

## 2021-06-23 ENCOUNTER — Ambulatory Visit: Payer: BC Managed Care – PPO | Admitting: Nurse Practitioner

## 2021-06-23 ENCOUNTER — Encounter: Payer: Self-pay | Admitting: Nurse Practitioner

## 2021-06-23 VITALS — BP 118/72 | HR 80 | Temp 98.2°F | Ht 66.0 in | Wt 188.0 lb

## 2021-06-23 DIAGNOSIS — F419 Anxiety disorder, unspecified: Secondary | ICD-10-CM

## 2021-06-23 DIAGNOSIS — F411 Generalized anxiety disorder: Secondary | ICD-10-CM | POA: Diagnosis not present

## 2021-06-23 NOTE — Progress Notes (Signed)
Subjective:  Patient ID: Tracey Avila, female    DOB: Oct 10, 1985  Age: 36 y.o. MRN: [id number]  CC:  Chief Complaint  Patient presents with   New Patient (Initial Visit)      HPI  This patient arrives today for the above.  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  She says overall she is feeling well but she does have a history of anxiety and takes Ativan as needed as well as Remeron nightly.  She does have a history of panic attacks as well and tells me those have been fairly well controlled recently.  Her previous primary care provider completed her last comprehensive physical exam and blood work back in July 2022.  She is able to show me these results today.  CBC is within normal with no abnormalities to Payes blood cells, hemoglobin, platelets, red blood cells.  Last A1c was 5.4, last TSH was 8.46 last metabolic panel showed EGFR 117 with normal glucose, BUN, creatinine, potassium, sodium, and calcium.  Last lipid panel showed total cholesterol of 177, HDL 40, triglycerides 128, and LDL of 113.  Past Medical History:  Diagnosis Date   Anxiety    Burning chest pain    Chest pain    Dermoid cyst    Right ovary   GERD (gastroesophageal reflux disease)    Left arm numbness    Left arm pain    Mid back pain    Pericarditis    Syncope       Family History  Problem Relation Age of Onset   Hypertension Father    Hyperlipidemia Father     Social History   Social History Narrative   Not on file   Social History   Tobacco Use   Smoking status: Never   Smokeless tobacco: Never  Substance Use Topics   Alcohol use: Yes    Comment: socially     Current Meds  Medication Sig   albuterol (VENTOLIN HFA) 108 (90 Base) MCG/ACT inhaler Inhale 2 puffs into the lungs every 4 (four) hours as needed.   EPINEPHrine 0.3 mg/0.3 mL IJ SOAJ injection  Inject into the muscle as directed.   LORazepam (ATIVAN) 0.5 MG tablet Take 1 mg by mouth daily as needed.   mirtazapine (REMERON) 15 MG tablet Take 15 mg by mouth at bedtime. 1/2 tab for first week then goes to whole tab starting 06-01-2021   omeprazole (PRILOSEC) 20 MG capsule Take 1 capsule (20 mg total) by mouth daily before breakfast.    ROS:  Review of Systems  Constitutional:  Negative for weight loss.  Respiratory:  Negative for shortness of breath.   Cardiovascular:  Negative for chest pain.  Gastrointestinal:  Negative for abdominal pain and blood in stool.  Psychiatric/Behavioral:  The patient is nervous/anxious.     Objective:   Today's Vitals: BP 118/72    Pulse 80    Temp 98.2 F (36.8 C) (Oral)    Ht _0  (1.676 m)    Wt 188 lb (85.3 kg)    SpO2 97%    BMI 30.34 kg/m  Vitals with BMI 06/23/2021 05/30/2021 05/19/2021  Height _1  _2  -  Weight 188 lbs 179 lbs -  BMI 96.29 52.84 -  Systolic 132 - 440  Diastolic 72 - 89  Pulse 80 - 73  Physical Exam Vitals reviewed.  Constitutional:      General: She is not in acute distress.    Appearance: Normal appearance.  HENT:     Head: Normocephalic and atraumatic.  Neck:     Vascular: No carotid bruit.  Cardiovascular:     Rate and Rhythm: Normal rate and regular rhythm.     Pulses: Normal pulses.     Heart sounds: Normal heart sounds.  Pulmonary:     Effort: Pulmonary effort is normal.     Breath sounds: Normal breath sounds.  Skin:    General: Skin is warm and dry.  Neurological:     General: No focal deficit present.     Mental Status: She is alert and oriented to person, place, and time.  Psychiatric:        Mood and Affect: Mood normal.        Behavior: Behavior normal.        Judgment: Judgment normal.         Assessment and Plan   1. Anxiety   2. GAD (generalized anxiety disorder)      Plan: 1.-2.  She is encouraged to continue taking medications as prescribed.  She will follow-up  with me in June or July for comprehensive physical exam with myself, and the course was encouraged to call us if she needs to be seen sooner.   Tests ordered No orders of the defined types were placed in this encounter.     No orders of the defined types were placed in this encounter.   Patient to follow-up in 5-6 months or sooner as needed  Ailene Ards, NP

## 2021-09-05 ENCOUNTER — Encounter (HOSPITAL_BASED_OUTPATIENT_CLINIC_OR_DEPARTMENT_OTHER): Payer: Self-pay | Admitting: Obstetrics and Gynecology

## 2021-09-05 ENCOUNTER — Other Ambulatory Visit: Payer: Self-pay

## 2021-09-05 NOTE — Progress Notes (Signed)
Spoke w/ via phone for pre-op interview: patient ?Lab needs dos: HCG, T&S, & CBC ?Lab results: EKG 05/19/21; Echo 04/25/21 ?COVID test: patient states asymptomatic no test needed. ?Arrive at [facility] 09/12/21 ?NPO after MN except clear liquids.Clear liquids from MN until 0430 ?Med rec completed. ?Medications to take morning of surgery: Albuterol and Ativan PRN- bring inhaler ?Diabetic medication: NA ?Patient instructed no nail polish to be worn day of surgery. ?Patient instructed to bring photo id and insurance card day of surgery. ?Patient aware to have Driver (ride ) / caregiver for 24 hours after surgery. (Jean-Paul, partner, to drive) ?Patient Special Instructions: NA ?Pre-Op special Istructions: NA ?Patient verbalized understanding of instructions that were given at this phone interview. ?Patient denies shortness of breath, chest pain, fever, cough at this phone interview.  ?

## 2021-09-07 ENCOUNTER — Encounter: Payer: Self-pay | Admitting: Cardiology

## 2021-09-07 ENCOUNTER — Ambulatory Visit: Payer: BC Managed Care – PPO | Admitting: Cardiology

## 2021-09-07 VITALS — BP 130/75 | HR 89 | Temp 97.3°F | Resp 16 | Ht 66.0 in | Wt 196.4 lb

## 2021-09-07 DIAGNOSIS — R030 Elevated blood-pressure reading, without diagnosis of hypertension: Secondary | ICD-10-CM

## 2021-09-07 DIAGNOSIS — R55 Syncope and collapse: Secondary | ICD-10-CM | POA: Insufficient documentation

## 2021-09-07 NOTE — Progress Notes (Signed)
? ?Primary Physician/Referring:  Ailene Ards, NP ? ?Patient ID: Tracey Avila, female    DOB: Apr 28, 1986, 36 y.o.   MRN: [id number] ? ?No chief complaint on file. ? ?HPI:   ? ?Tracey Avila  is a 36 y.o. 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  She had COVID at that time, mildly abnormal EKG at the time with T wave abnormality in anterolateral leads and suspected pericarditis. ? ?She presents for follow-up of elevated blood pressure, she is also having surgery for dermoid cyst of the ovary on 09/12/2021.  Since COVID infection, she had noticed decreasing heart rate variability with exercise but states that this is improving and is worried about anesthesia. ? ?Past Medical History:  ?Diagnosis Date  ? Anxiety   ? Asthma   ? Burning chest pain   ? Chest pain   ? Dermoid cyst   ? Right ovary  ? GERD (gastroesophageal reflux disease)   ? Left arm numbness   ? Mid back pain   ? Pericarditis   ? Pericarditis   ? Syncope   ? ?Past Surgical History:  ?Procedure Laterality Date  ? TONSILLECTOMY    ? WISDOM TOOTH EXTRACTION    ? ?Family History  ?Problem Relation Age of Onset  ? Hypertension Father   ? Hyperlipidemia Father   ?  ?Social History  ? ?Tobacco Use  ? Smoking status: Never  ? Smokeless tobacco: Never  ?Substance Use Topics  ? Alcohol use: Yes  ?  Comment: socially  ? ?Marital Status: Single  ?ROS  ?Review of Systems  ?Cardiovascular:  Negative for chest pain, dyspnea on exertion and leg swelling.  ?Gastrointestinal:  Negative for melena.  ?Psychiatric/Behavioral:  The patient has insomnia and is nervous/anxious.   ?Objective  ?Blood pressure 130/75, pulse 89, temperature (!) 97.3 ?F (36.3 ?C), temperature source Temporal, resp. rate 16, height 5'  6" (1.676 m), weight 196 lb 6.4 oz (89.1 kg), last menstrual period 08/31/2021, SpO2 98 %. Body mass index is 31.7 kg/m?.  ? ?  09/07/2021  ? 10:35 AM 09/07/2021  ? 10:07 AM 09/05/2021  ? 10:26 AM  ?Vitals with BMI  ?Height  '5\' 6"'$  '5\' 6"'$   ?Weight  196 lbs 6 oz 197 lbs  ?BMI  31.71 31.81  ?Systolic 993 570   ?Diastolic 75 91   ?Pulse  89   ?  ?Physical Exam ?Neck:  ?   Vascular: No carotid bruit or JVD.  ?Cardiovascular:  ?   Rate and Rhythm: Normal rate and regular rhythm.  ?   Pulses: Intact distal pulses.  ?   Heart sounds: Normal heart sounds. No murmur heard. ?  No gallop.  ?Pulmonary:  ?   Effort: Pulmonary effort is normal.  ?   Breath sounds: Normal breath sounds.  ?Chest:  ?   Chest wall: No tenderness.  ?Abdominal:  ?   General: Bowel sounds are normal.  ?   Palpations: Abdomen is soft.  ?Musculoskeletal:     ?   General: No swelling.  ?  ?Laboratory examination:  ? ?Recent Labs  ?  04/03/21 ?0301  ?NA 135  ?K 3.6  ?CL 102  ?CO2 23  ?GLUCOSE 111*  ?BUN 8  ?CREATININE 0.67  ?CALCIUM 9.4  ?  GFRNONAA >60  ? ?CrCl cannot be calculated (Patient's most recent lab result is older than the maximum 21 days allowed.).  ? ?  Latest Ref Rng & Units 04/03/2021  ?  3:01 AM  ?CMP  ?Glucose 70 - 99 mg/dL 111    ?BUN 6 - 20 mg/dL 8    ?Creatinine 0.44 - 1.00 mg/dL 0.67    ?Sodium 135 - 145 mmol/L 135    ?Potassium 3.5 - 5.1 mmol/L 3.6    ?Chloride 98 - 111 mmol/L 102    ?CO2 22 - 32 mmol/L 23    ?Calcium 8.9 - 10.3 mg/dL 9.4    ?Total Protein 6.5 - 8.1 g/dL 7.2    ?Total Bilirubin 0.3 - 1.2 mg/dL 0.7    ?Alkaline Phos 38 - 126 U/L 49    ?AST 15 - 41 U/L 14    ?ALT 0 - 44 U/L 15    ? ? ?  Latest Ref Rng & Units 04/03/2021  ?  3:01 AM  ?CBC  ?WBC 4.0 - 10.5 K/uL 14.8    ?Hemoglobin 12.0 - 15.0 g/dL 14.2    ?Hematocrit 36.0 - 46.0 % 42.0    ?Platelets 150 - 400 K/uL 260    ? ? ?External labs:  ? ?Labs 03/07/2021: ? ?Total cholesterol 177, triglycerides 128, HDL 40, LDL 113.  Non-HDL cholesterol 137. ? ?TSH 1.38,  normal. ? ?Medications and allergies  ? ?Allergies  ?Allergen Reactions  ? Bee Venom Anaphylaxis  ? Wasp Venom Anaphylaxis  ?  ?Medication list after today's encounter  ? ?Current Outpatient Medications  ?Medication Instructions  ? albuterol (VENTOLIN HFA) 108 (90 Base) MCG/ACT inhaler 2 puffs, Inhalation, Every 4 hours PRN  ? EPINEPHrine 0.3 mg/0.3 mL IJ SOAJ injection Intramuscular, As directed  ? LORazepam (ATIVAN) 1 mg, Oral, Daily PRN  ? mirtazapine (REMERON) 15 mg, Oral, Daily at bedtime, 1/2 tab for first week then goes to whole tab starting 06-01-2021  ? ? ?Radiology:  ? ?CT angio of the chest, abdomen and pelvis 04/03/2021: ?Cardiovascular: Normal thoracic aorta. No cardiomegaly or ?pericardial effusion. Central pulmonary arteries appear to be ?patent. Normal proximal great vessels.  ?Mediastinum/Nodes: Negative. No mediastinal mass or lymphadenopathy.  ?Lungs/Pleura: Major airways are patent. Lung volumes are normal and both lungs appear clear. No pneumothorax or pleural effusion. 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 Recommend GYN surgery follow-up. ? ?Cardiac Studies:  ? ?PCV ECHOCARDIOGRAM COMPLETE 04/25/2021 ? ?Narrative ?Echocardiogram 04/25/2021: ?Normal LV systolic function with visual EF 60-65%. Left ventricle cavity is normal in size. Normal global wall motion. Normal diastolic filling pattern, normal LAP. ?Mild tricuspid regurgitation. ?No prior study for comparison. ?   ?EKG:  ? ?EKG 05/19/2021: Normal sinus rhythm at a rate of 67 bpm, incomplete right bundle branch block.  No evidence of ischemia, normal EKG.  T wave inversion in V1 to V3, normal variant.  No significant change from 04/03/2021 however compared to 04/24/2021, diffuse inferior and anterolateral T wave inversion has completely resolved. ? ?EKG 04/24/2021: Normal sinus rhythm at rate of 84 bpm, left atrial enlargement, normal axis.  Incomplete right bundle branch block.  Diffuse 2 mm ST  depression with T wave inversion, cannot exclude inferior and anterolateral ischemia.  Normal QT interval.  Abnormal EKG. Compared to 04/12/2021 and 04/03/2021 EKG, diffuse ST-T changes are new- Normal EKG. ? ?Assessment  ? ?  ICD-10-CM   ?1. Elevated BP without diagnosis of hypertension  R03.0   ?  ? ?  Medications Discontinued During This Encounter  ?Medication Reason  ? omeprazole (PRILOSEC) 20 MG capsule   ?  ?No orders of the defined types were placed in this encounter. ? ?No orders of the defined types were placed in this encounter. ? ? ?Recommendations:  ? ?Dollye Glasser is a 36 y.o. 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  She had COVID at that time, mildly abnormal EKG at the time with T wave abnormality in anterolateral leads and suspected pericarditis. ? ?She has not had any further chest pain.  She is currently doing well.  She presents here for follow-up of elevated blood pressure, she does have prehypertension and labile hypertension.  Suspect with weight loss and change in contraceptive medication, hopefully the blood pressure will improve otherwise would have low threshold to start her on therapy.  Rechecking the blood pressure today, normal.  Hence I did not make any changes.  Weight loss discussed.  I will see her back on a as needed basis. ? ?She is scheduled for right oophorectomy, do not see any contraindication and can be done under low risk.   ?  ? ? ? ?Adrian Prows, MD, [facility] ?09/07/2021, 10:42 AM ?Office: [phone] ? ?

## 2021-09-07 NOTE — Telephone Encounter (Signed)
From patient.

## 2021-09-11 NOTE — H&P (Signed)
Tracey Avila is an 36 y.o. female. She had CT as work up for chest pain when a 7.9 cm right dermoid cyst was seen of ovary. She had pericarditis which has resolved. Also had Covid recently, now recovered. Ultrasound in office noted a right ovarian cyst most consistent with dermoid. Repeat ultrasound noted probable dermoid approximately 9 cm.  ? ?Pertinent Gynecological History: ?Menses: flow is moderate ?Bleeding:  ?Contraception: condoms ?DES exposure: denies ?Blood transfusions: none ?Sexually transmitted diseases: no past history ?Previous GYN Procedures:  ?Last mammogram: Date:  ?Last pap: normal Date:  ?OB History: G P ? ?Menstrual History: ?Menarche age: unknown ?Patient's last menstrual period was 08/31/2021. ?  ? ?Past Medical History:  ?Diagnosis Date  ? Anxiety   ? Asthma   ? Burning chest pain   ? Chest pain   ? Dermoid cyst   ? Right ovary  ? GERD (gastroesophageal reflux disease)   ? Left arm numbness   ? Mid back pain   ? Pericarditis   ? Pericarditis   ? Syncope   ? ? ?Past Surgical History:  ?Procedure Laterality Date  ? TONSILLECTOMY    ? WISDOM TOOTH EXTRACTION    ? ? ?Family History  ?Problem Relation Age of Onset  ? Hypertension Father   ? Hyperlipidemia Father   ? ? ?Social History:  reports that she has never smoked. She has never used smokeless tobacco. She reports current alcohol use. She reports that she does not use drugs. ? ?Allergies:  ?Allergies  ?Allergen Reactions  ? Bee Venom Anaphylaxis  ? Wasp Venom Anaphylaxis  ? ? ?No medications prior to admission.  ? ? ?Review of Systems  ?Constitutional:  Negative for fever.  ? ?Height '5\' 6"'$  (1.676 m), weight 89.4 kg, last menstrual period 08/31/2021. ?Physical Exam ?Cardiovascular:  ?   Rate and Rhythm: Normal rate.  ?Pulmonary:  ?   Effort: Pulmonary effort is normal.  ? ? ?No results found for this or any previous visit (from the past 24 hour(s)). ? ?No results found. ? ?Assessment/Plan: ?36 yo with right ovarian cyst consistent with  dermoid ?Laparoscopy, possible laparotomy, right oophorectomy, possible right salpingectomy discussed. Risks reviewed including infection, organ damage, bleeding/transfusion-HIV/Hep, DVT/PE, pneumonia. She states she understands and agrees. ? ?Shon Millet II ?09/11/2021, 3:37 PM ? ?

## 2021-09-12 ENCOUNTER — Ambulatory Visit (HOSPITAL_BASED_OUTPATIENT_CLINIC_OR_DEPARTMENT_OTHER): Payer: BC Managed Care – PPO | Admitting: Anesthesiology

## 2021-09-12 ENCOUNTER — Encounter (HOSPITAL_BASED_OUTPATIENT_CLINIC_OR_DEPARTMENT_OTHER)
Admission: RE | Disposition: A | Discharge status: Home / Self Care | Payer: Self-pay | Source: Home / Self Care | Attending: Obstetrics and Gynecology

## 2021-09-12 ENCOUNTER — Other Ambulatory Visit: Payer: Self-pay

## 2021-09-12 ENCOUNTER — Ambulatory Visit (HOSPITAL_BASED_OUTPATIENT_CLINIC_OR_DEPARTMENT_OTHER)
Admission: RE | Admit: 2021-09-12 | Discharge: 2021-09-12 | Disposition: A | Discharge status: Home / Self Care | Payer: BC Managed Care – PPO | Attending: Obstetrics and Gynecology | Admitting: Obstetrics and Gynecology

## 2021-09-12 ENCOUNTER — Encounter (HOSPITAL_BASED_OUTPATIENT_CLINIC_OR_DEPARTMENT_OTHER): Payer: Self-pay | Admitting: Obstetrics and Gynecology

## 2021-09-12 DIAGNOSIS — J45909 Unspecified asthma, uncomplicated: Secondary | ICD-10-CM | POA: Insufficient documentation

## 2021-09-12 DIAGNOSIS — N83201 Unspecified ovarian cyst, right side: Secondary | ICD-10-CM | POA: Insufficient documentation

## 2021-09-12 DIAGNOSIS — K219 Gastro-esophageal reflux disease without esophagitis: Secondary | ICD-10-CM | POA: Insufficient documentation

## 2021-09-12 HISTORY — PX: LAPAROSCOPY: SHX197

## 2021-09-12 HISTORY — PX: OOPHORECTOMY: SHX6387

## 2021-09-12 HISTORY — DX: Unspecified asthma, uncomplicated: J45.909

## 2021-09-12 HISTORY — DX: Angina pectoris, unspecified: I20.9

## 2021-09-12 LAB — CBC
HCT: 40.2 % (ref 36.0–46.0)
Hemoglobin: 13.8 g/dL (ref 12.0–15.0)
MCH: 29.9 pg (ref 26.0–34.0)
MCHC: 34.3 g/dL (ref 30.0–36.0)
MCV: 87 fL (ref 80.0–100.0)
Platelets: 254 10*3/uL (ref 150–400)
RBC: 4.62 MIL/uL (ref 3.87–5.11)
RDW: 12 % (ref 11.5–15.5)
WBC: 9.1 10*3/uL (ref 4.0–10.5)
nRBC: 0 % (ref 0.0–0.2)

## 2021-09-12 LAB — TYPE AND SCREEN
ABO/RH(D): O POS
Antibody Screen: NEGATIVE

## 2021-09-12 LAB — ABO/RH: ABO/RH(D): O POS

## 2021-09-12 LAB — HCG, SERUM, QUALITATIVE: Preg, Serum: NEGATIVE

## 2021-09-12 SURGERY — LAPAROSCOPY, DIAGNOSTIC
Anesthesia: General | Site: Abdomen | Laterality: Right

## 2021-09-12 MED ORDER — KETAMINE HCL 10 MG/ML IJ SOLN
INTRAMUSCULAR | Status: DC | PRN
  Administered 2021-09-12: 30 mg via INTRAVENOUS

## 2021-09-12 MED ORDER — LIDOCAINE 2% (20 MG/ML) 5 ML SYRINGE
INTRAMUSCULAR | Status: DC | PRN
  Administered 2021-09-12: 60 mg via INTRAVENOUS

## 2021-09-12 MED ORDER — PROPOFOL 10 MG/ML IV BOLUS
INTRAVENOUS | Status: AC
Start: 2021-09-12 — End: ?
  Filled 2021-09-12: qty 20

## 2021-09-12 MED ORDER — LACTATED RINGERS IV SOLN: INTRAVENOUS | Status: DC

## 2021-09-12 MED ORDER — 0.9 % SODIUM CHLORIDE (POUR BTL) OPTIME
TOPICAL | Status: DC | PRN
  Administered 2021-09-12: 500 mL

## 2021-09-12 MED ORDER — SCOPOLAMINE 1 MG/3DAYS TD PT72: 1.0000 | MEDICATED_PATCH | TRANSDERMAL | Status: DC

## 2021-09-12 MED ORDER — CEFAZOLIN SODIUM-DEXTROSE 2-4 GM/100ML-% IV SOLN
INTRAVENOUS | Status: AC
  Filled 2021-09-12: qty 100

## 2021-09-12 MED ORDER — PROPOFOL 10 MG/ML IV BOLUS
INTRAVENOUS | Status: DC | PRN
Start: 2021-09-12 — End: 2021-09-12
  Administered 2021-09-12: 200 mg via INTRAVENOUS

## 2021-09-12 MED ORDER — FENTANYL CITRATE (PF) 100 MCG/2ML IJ SOLN
INTRAMUSCULAR | Status: AC
  Filled 2021-09-12: qty 2

## 2021-09-12 MED ORDER — FENTANYL CITRATE (PF) 100 MCG/2ML IJ SOLN
25.0000 ug | INTRAMUSCULAR | Status: DC | PRN
  Administered 2021-09-12 (×2): 25 ug via INTRAVENOUS

## 2021-09-12 MED ORDER — KETOROLAC TROMETHAMINE 30 MG/ML IJ SOLN
INTRAMUSCULAR | Status: DC | PRN
  Administered 2021-09-12: 30 mg via INTRAVENOUS

## 2021-09-12 MED ORDER — ACETAMINOPHEN 500 MG PO TABS
1000.0000 mg | ORAL_TABLET | Freq: Once | ORAL | Status: AC
  Administered 2021-09-12: 1000 mg via ORAL

## 2021-09-12 MED ORDER — MIDAZOLAM HCL 2 MG/2ML IJ SOLN
INTRAMUSCULAR | Status: DC | PRN
  Administered 2021-09-12: 2 mg via INTRAVENOUS

## 2021-09-12 MED ORDER — AMISULPRIDE (ANTIEMETIC) 5 MG/2ML IV SOLN: 10.0000 mg | Freq: Once | INTRAVENOUS | Status: DC | PRN

## 2021-09-12 MED ORDER — PROPOFOL 10 MG/ML IV BOLUS
INTRAVENOUS | Status: AC
  Filled 2021-09-12: qty 20

## 2021-09-12 MED ORDER — BUPIVACAINE HCL (PF) 0.5 % IJ SOLN
INTRAMUSCULAR | Status: DC | PRN
  Administered 2021-09-12: 30 mL

## 2021-09-12 MED ORDER — CEFAZOLIN SODIUM-DEXTROSE 2-4 GM/100ML-% IV SOLN
2.0000 g | INTRAVENOUS | Status: AC
  Administered 2021-09-12: 2 g via INTRAVENOUS

## 2021-09-12 MED ORDER — ACETAMINOPHEN 500 MG PO TABS
ORAL_TABLET | ORAL | Status: AC
  Filled 2021-09-12: qty 2

## 2021-09-12 MED ORDER — SUGAMMADEX SODIUM 200 MG/2ML IV SOLN
INTRAVENOUS | Status: DC | PRN
  Administered 2021-09-12: 200 mg via INTRAVENOUS

## 2021-09-12 MED ORDER — ONDANSETRON HCL 4 MG/2ML IJ SOLN
INTRAMUSCULAR | Status: AC
Start: 2021-09-12 — End: ?
  Filled 2021-09-12: qty 2

## 2021-09-12 MED ORDER — WHITE PETROLATUM EX OINT
TOPICAL_OINTMENT | CUTANEOUS | Status: AC
  Filled 2021-09-12: qty 5

## 2021-09-12 MED ORDER — MIDAZOLAM HCL 2 MG/2ML IJ SOLN
INTRAMUSCULAR | Status: AC
  Filled 2021-09-12: qty 2

## 2021-09-12 MED ORDER — ROCURONIUM BROMIDE 10 MG/ML (PF) SYRINGE
PREFILLED_SYRINGE | INTRAVENOUS | Status: AC
  Filled 2021-09-12: qty 10

## 2021-09-12 MED ORDER — FENTANYL CITRATE (PF) 100 MCG/2ML IJ SOLN
INTRAMUSCULAR | Status: DC | PRN
Start: 2021-09-12 — End: 2021-09-12
  Administered 2021-09-12: 25 ug via INTRAVENOUS
  Administered 2021-09-12: 100 ug via INTRAVENOUS

## 2021-09-12 MED ORDER — ROCURONIUM BROMIDE 10 MG/ML (PF) SYRINGE
PREFILLED_SYRINGE | INTRAVENOUS | Status: DC | PRN
  Administered 2021-09-12: 50 mg via INTRAVENOUS
  Administered 2021-09-12: 20 mg via INTRAVENOUS

## 2021-09-12 MED ORDER — DEXMEDETOMIDINE (PRECEDEX) IN NS 20 MCG/5ML (4 MCG/ML) IV SYRINGE
PREFILLED_SYRINGE | INTRAVENOUS | Status: AC
  Filled 2021-09-12: qty 5

## 2021-09-12 MED ORDER — SODIUM CHLORIDE 0.9 % IR SOLN
Status: DC | PRN
  Administered 2021-09-12: 400 mL
  Administered 2021-09-12: 1000 mL

## 2021-09-12 MED ORDER — ONDANSETRON HCL 4 MG/2ML IJ SOLN
INTRAMUSCULAR | Status: DC | PRN
  Administered 2021-09-12: 4 mg via INTRAVENOUS

## 2021-09-12 MED ORDER — DEXAMETHASONE SODIUM PHOSPHATE 10 MG/ML IJ SOLN
INTRAMUSCULAR | Status: AC
Start: 2021-09-12 — End: ?
  Filled 2021-09-12: qty 1

## 2021-09-12 MED ORDER — LIDOCAINE HCL (PF) 2 % IJ SOLN
INTRAMUSCULAR | Status: AC
  Filled 2021-09-12: qty 5

## 2021-09-12 MED ORDER — DEXMEDETOMIDINE (PRECEDEX) IN NS 20 MCG/5ML (4 MCG/ML) IV SYRINGE
PREFILLED_SYRINGE | INTRAVENOUS | Status: DC | PRN
Start: 2021-09-12 — End: 2021-09-12
  Administered 2021-09-12 (×2): 8 ug via INTRAVENOUS

## 2021-09-12 MED ORDER — DEXAMETHASONE SODIUM PHOSPHATE 10 MG/ML IJ SOLN
INTRAMUSCULAR | Status: DC | PRN
  Administered 2021-09-12: 10 mg via INTRAVENOUS

## 2021-09-12 MED ORDER — KETOROLAC TROMETHAMINE 30 MG/ML IJ SOLN
INTRAMUSCULAR | Status: AC
  Filled 2021-09-12: qty 1

## 2021-09-12 MED ORDER — KETAMINE HCL 50 MG/5ML IJ SOSY
PREFILLED_SYRINGE | INTRAMUSCULAR | Status: AC
  Filled 2021-09-12: qty 5

## 2021-09-12 SURGICAL SUPPLY — 73 items
ADH SKN CLS APL DERMABOND .7 (GAUZE/BANDAGES/DRESSINGS) ×4
APL PRP STRL LF DISP 70% ISPRP (MISCELLANEOUS)
APL SKNCLS STERI-STRIP NONHPOA (GAUZE/BANDAGES/DRESSINGS)
BAG RETRIEVAL 10 (BASKET) ×1
BARRIER ADHS 3X4 INTERCEED (GAUZE/BANDAGES/DRESSINGS) IMPLANT
BENZOIN TINCTURE PRP APPL 2/3 (GAUZE/BANDAGES/DRESSINGS) IMPLANT
BLADE CLIPPER SENSICLIP SURGIC (BLADE) IMPLANT
BLADE EXTENDED COATED 6.5IN (ELECTRODE) IMPLANT
BRR ADH 4X3 ABS CNTRL BYND (GAUZE/BANDAGES/DRESSINGS)
CABLE HIGH FREQUENCY MONO STRZ (ELECTRODE) IMPLANT
CATH ROBINSON RED A/P 16FR (CATHETERS) ×2 IMPLANT
CHLORAPREP W/TINT 26 (MISCELLANEOUS) ×2 IMPLANT
COVER MAYO STAND STRL (DRAPES) ×5 IMPLANT
DECANTER SPIKE VIAL GLASS SM (MISCELLANEOUS) IMPLANT
DERMABOND ADVANCED (GAUZE/BANDAGES/DRESSINGS) ×1
DERMABOND ADVANCED .7 DNX12 (GAUZE/BANDAGES/DRESSINGS) ×4 IMPLANT
DRAPE WARM FLUID 44X44 (DRAPES) ×2 IMPLANT
DRSG OPSITE POSTOP 4X10 (GAUZE/BANDAGES/DRESSINGS) ×2 IMPLANT
DURAPREP 26ML APPLICATOR (WOUND CARE) ×5 IMPLANT
ELECT REM PT RETURN 9FT ADLT (ELECTROSURGICAL) ×5
ELECTRODE REM PT RTRN 9FT ADLT (ELECTROSURGICAL) ×2 IMPLANT
GAUZE 4X4 16PLY ~~LOC~~+RFID DBL (SPONGE) ×5 IMPLANT
GLOVE SURG ENC MOIS LTX SZ8 (GLOVE) ×10 IMPLANT
GOWN STRL REUS W/TWL XL LVL3 (GOWN DISPOSABLE) ×5 IMPLANT
HOLDER FOLEY CATH W/STRAP (MISCELLANEOUS) IMPLANT
IV NS 1000ML (IV SOLUTION) ×10
IV NS 1000ML BAXH (IV SOLUTION) ×4 IMPLANT
KIT TURNOVER CYSTO (KITS) ×5 IMPLANT
LIGASURE IMPACT 36 18CM CVD LR (INSTRUMENTS) IMPLANT
NDL INSUFFLATION 14GA 150MM (NEEDLE) IMPLANT
NDL SPNL 18GX3.5 QUINCKE PK (NEEDLE) IMPLANT
NEEDLE INSUFFLATION 120MM (ENDOMECHANICALS) ×5 IMPLANT
NEEDLE INSUFFLATION 14GA 150MM (NEEDLE) IMPLANT
NEEDLE SPNL 18GX3.5 QUINCKE PK (NEEDLE) ×5 IMPLANT
NS IRRIG 500ML POUR BTL (IV SOLUTION) ×5 IMPLANT
PACK ABDOMINAL GYN (CUSTOM PROCEDURE TRAY) ×5 IMPLANT
PACK LAPAROSCOPY BASIN (CUSTOM PROCEDURE TRAY) ×5 IMPLANT
PACK TRENDGUARD 450 HYBRID PRO (MISCELLANEOUS) ×4 IMPLANT
PAD OB MATERNITY 4.3X12.25 (PERSONAL CARE ITEMS) ×5 IMPLANT
PAD PREP 24X48 CUFFED NSTRL (MISCELLANEOUS) ×5 IMPLANT
SCISSORS LAP 5X45 EPIX DISP (ENDOMECHANICALS) IMPLANT
SEALER TISSUE G2 CVD JAW 45CM (ENDOMECHANICALS) ×5 IMPLANT
SET SUCTION IRRIG HYDROSURG (IRRIGATION / IRRIGATOR) ×3 IMPLANT
SET TUBE SMOKE EVAC HIGH FLOW (TUBING) ×5 IMPLANT
SOLUTION ELECTROLUBE (MISCELLANEOUS) IMPLANT
SPONGE T-LAP 18X18 ~~LOC~~+RFID (SPONGE) IMPLANT
STRIP CLOSURE SKIN 1/4X4 (GAUZE/BANDAGES/DRESSINGS) IMPLANT
SUT MNCRL 0 MO-4 VIOLET 18 CR (SUTURE) ×4 IMPLANT
SUT MNCRL 0 VIOLET 6X18 (SUTURE) ×4 IMPLANT
SUT MNCRL AB 0 CT1 27 (SUTURE) ×2 IMPLANT
SUT MONOCRYL 0 6X18 (SUTURE) ×1
SUT MONOCRYL 0 MO 4 18  CR/8 (SUTURE) ×1
SUT PDS AB 0 CTX 60 (SUTURE) ×4 IMPLANT
SUT PLAIN 2 0 (SUTURE) ×5
SUT PLAIN ABS 2-0 CT1 27XMFL (SUTURE) ×4 IMPLANT
SUT VIC AB 3-0 PS2 18 (SUTURE)
SUT VIC AB 3-0 PS2 18XBRD (SUTURE) IMPLANT
SUT VIC AB 4-0 KS 27 (SUTURE) ×5 IMPLANT
SUT VIC AB 4-0 PS2 27 (SUTURE) ×5 IMPLANT
SUT VICRYL 0 UR6 27IN ABS (SUTURE) ×5 IMPLANT
SYR 30ML LL (SYRINGE) ×5 IMPLANT
SYR BULB IRRIG 60ML STRL (SYRINGE) ×5 IMPLANT
SYS BAG RETRIEVAL 10MM (BASKET) ×4
SYSTEM BAG RETRIEVAL 10MM (BASKET) ×2 IMPLANT
TOWEL OR 17X26 10 PK STRL BLUE (TOWEL DISPOSABLE) ×5 IMPLANT
TRAY FOLEY W/BAG SLVR 14FR LF (SET/KITS/TRAYS/PACK) ×5 IMPLANT
TRENDGUARD 450 HYBRID PRO PACK (MISCELLANEOUS) ×5
TROCAR BALLN 12MMX100 BLUNT (TROCAR) IMPLANT
TROCAR BLADELESS OPT 5 100 (ENDOMECHANICALS) ×5 IMPLANT
TROCAR XCEL NON-BLD 11X100MML (ENDOMECHANICALS) ×5 IMPLANT
TUBE CONNECTING 12X1/4 (SUCTIONS) IMPLANT
WARMER LAPAROSCOPE (MISCELLANEOUS) ×5 IMPLANT
WATER STERILE IRR 500ML POUR (IV SOLUTION) ×5 IMPLANT

## 2021-09-12 NOTE — Anesthesia Preprocedure Evaluation (Signed)
Anesthesia Evaluation  ?Patient identified by MRN, date of birth, ID band ?Patient awake ? ? ? ?Reviewed: ?Allergy & Precautions, NPO status , Patient's Chart, lab work & pertinent test results ? ?Airway ?Mallampati: II ? ?TM Distance: >3 FB ?Neck ROM: Full ? ? ? Dental ? ?(+) Dental Advisory Given ?  ?Pulmonary ?asthma ,  ?  ?breath sounds clear to auscultation ? ? ? ? ? ? Cardiovascular ? ?Rhythm:Regular Rate:Normal ? ? ?  ?Neuro/Psych ?negative neurological ROS ?   ? GI/Hepatic ?Neg liver ROS, GERD  ,  ?Endo/Other  ?negative endocrine ROS ? Renal/GU ?negative Renal ROS  ? ?  ?Musculoskeletal ? ? Abdominal ?  ?Peds ? Hematology ?negative hematology ROS ?(+)   ?Anesthesia Other Findings ? ? Reproductive/Obstetrics ? ?  ? ? ? ? ? ? ? ? ? ? ? ? ? ?  ?  ? ? ? ? ? ? ? ? ?Anesthesia Physical ?Anesthesia Plan ? ?ASA: 2 ? ?Anesthesia Plan: General  ? ?Post-op Pain Management: Tylenol PO (pre-op)*, Toradol IV (intra-op)* and Ketamine IV*  ? ?Induction: Intravenous ? ?PONV Risk Score and Plan: 4 or greater and Scopolamine patch - Pre-op, Midazolam, Dexamethasone, Ondansetron and Treatment may vary due to age or medical condition ? ?Airway Management Planned: Oral ETT ? ?Additional Equipment: None ? ?Intra-op Plan:  ? ?Post-operative Plan: Extubation in OR ? ?Informed Consent: I have reviewed the patients History and Physical, chart, labs and discussed the procedure including the risks, benefits and alternatives for the proposed anesthesia with the patient or authorized representative who has indicated his/her understanding and acceptance.  ? ? ? ?Dental advisory given ? ?Plan Discussed with: CRNA ? ?Anesthesia Plan Comments:   ? ? ? ? ? ? ?Anesthesia Quick Evaluation ? ?

## 2021-09-12 NOTE — Discharge Instructions (Signed)
?  Post Anesthesia Home Care Instructions ? ?**You may begin taking Tylenol(acetaminophen) after 12:00 noon and Ibuprofen(Motrin, Advil) or other Non-steroidal anti-inflammatories after 3:12 pm** ? ?Activity: ?Get plenty of rest for the remainder of the day. A responsible adult should stay with you for 24 hours following the procedure.  ?For the next 24 hours, DO NOT: ?-Drive a car ?-Paediatric nurse ?-Drink alcoholic beverages ?-Take any medication unless instructed by your physician ?-Make any legal decisions or sign important papers. ? ?Meals: ?Start with liquid foods such as gelatin or soup. Progress to regular foods as tolerated. Avoid greasy, spicy, heavy foods. If nausea and/or vomiting occur, drink only clear liquids until the nausea and/or vomiting subsides. Call your physician if vomiting continues. ? ?Special Instructions/Symptoms: ?Your throat may feel dry or sore from the anesthesia or the breathing tube placed in your throat during surgery. If this causes discomfort, gargle with warm salt water. The discomfort should disappear within 24 hours. ? ? ?DISCHARGE INSTRUCTIONS: Laparoscopy ? ?The following instructions have been prepared to help you care for yourself upon your return home today. ? ?Wound care: ? Do not get the incision wet for the first 24 hours. The incision should be kept clean and dry. ? Surgical glue will begin to flake off on its own in about 1-2 weeks.  DO NOT PULL IT OFF. ? Should the incision become sore, red, and swollen after the first week, check with your doctor. ? ?Personal hygiene: ? Shower the day after your procedure. ? ?Activity and limitations: ? Do NOT drive or operate any equipment today. ? Do NOT lift anything more than 15 pounds for 2-3 weeks after surgery. ? Do NOT rest in bed all day. ? Walking is encouraged. Walk each day, starting slowly with 5-minute walks 3 or 4 times a day. Slowly increase the length of your walks. ? Walk up and down stairs slowly. ? Do NOT do  strenuous activities, such as golfing, playing tennis, bowling, running, biking, weight lifting, gardening, mowing, or vacuuming for 2-4 weeks. Ask your doctor when it is okay to start. ? ?Diet: Eat a light meal as desired this evening. You may resume your usual diet tomorrow. ? ?Return to work: This is dependent on the type of work you do. For the most part you can return to a desk job within a week of surgery. If you are more active at work, please discuss this with your doctor. ? ?What to expect after your surgery: You may have a slight burning sensation when you urinate on the first day. You may have a very small amount of blood in the urine. Expect to have a small amount of vaginal discharge/light bleeding for 1-2 weeks. It is not unusual to have abdominal soreness and bruising for up to 2 weeks. You may be tired and need more rest for about 1 week. You may experience shoulder pain for 24-72 hours. Lying flat in bed may relieve it. ? ?Call your doctor for any of the following: ? Develop a fever of 100.4 or greater ? Inability to urinate 6 hours after discharge from hospital ? Severe pain not relieved by pain medications ? Persistent of heavy bleeding at incision site ? Redness or swelling around incision site after a week ? Increasing nausea or vomiting ? ?

## 2021-09-12 NOTE — Progress Notes (Signed)
09/12/2021 ? ?9:06 AM ? ?PATIENT:  Tracey Avila  36 y.o. female ? ?PRE-OPERATIVE DIAGNOSIS:  RIGHT DERMOID CYST ? ?POST-OPERATIVE DIAGNOSIS:  RIGHT DERMOID CYST ? ?PROCEDURE:  Procedure(s): ?LAPAROSCOPY DIAGNOSTIC (N/A) ?OOPHORECTOMY (Right) ? ?SURGEON:  Surgeon(s) and Role: ?   Everlene Farrier, MD - Primary ? ?PHYSICIAN ASSISTANT:  ? ?ASSISTANTS: none  ? ?ANESTHESIA:   general ? ?EBL:  20 mL  ? ?BLOOD ADMINISTERED:none ? ?DRAINS: none  ? ?LOCAL MEDICATIONS USED:  MARCAINE    and Amount: 30 ml ? ?SPECIMEN:  Source of Specimen:  right ovary and right ovary aspirate ? ?DISPOSITION OF SPECIMEN:  PATHOLOGY ? ?COUNTS:  YES ? ?TOURNIQUET:  * No tourniquets in log * ? ?DICTATION: .Other Dictation: Dictation Number [id number] ? ?PLAN OF CARE: Discharge to home after PACU ? ?PATIENT DISPOSITION:  PACU - hemodynamically stable. ?  ?Delay start of Pharmacological VTE agent (>24hrs) due to surgical blood loss or risk of bleeding: not applicable ? ?

## 2021-09-12 NOTE — Discharge Summary (Signed)
Date of admission  09/12/21 ?Date of discharge  09/12/21 ?Admission diagnosis  Right Ovarian Dermoid Cyst ?Discharge diagnosis  Right Ovarian Dermoid Cyst ?Procedure Laparoscopic Right Oophorectomy ?Course: taken to OR for above procedure. Post operatively transferred to PACU and from there discharged home ?Follow Up 1-2 weeks in office ?Discharge instructions have been reviewed. ?

## 2021-09-12 NOTE — Anesthesia Postprocedure Evaluation (Signed)
Anesthesia Post Note ? ?Patient: Tracey Avila ? ?Procedure(s) Performed: LAPAROSCOPY DIAGNOSTIC (Abdomen) ?OOPHORECTOMY (Right: Abdomen) ? ?  ? ?Patient location during evaluation: PACU ?Anesthesia Type: General ?Level of consciousness: awake and alert ?Pain management: pain level controlled ?Vital Signs Assessment: post-procedure vital signs reviewed and stable ?Respiratory status: spontaneous breathing, nonlabored ventilation, respiratory function stable and patient connected to nasal cannula oxygen ?Cardiovascular status: blood pressure returned to baseline and stable ?Postop Assessment: no apparent nausea or vomiting ?Anesthetic complications: no ? ? ?No notable events documented. ? ?Last Vitals:  ?Vitals:  ? 09/12/21 0945 09/12/21 1018  ?BP: (!) 128/92 121/80  ?Pulse: 77 80  ?Resp: 18 18  ?Temp:  37 ?C  ?SpO2: 100% 100%  ?  ?Last Pain:  ?Vitals:  ? 09/12/21 1018  ?TempSrc:   ?PainSc: 2   ? ? ?  ?  ?  ?  ?  ?  ? ?Suzette Battiest E ? ? ? ? ?

## 2021-09-12 NOTE — Progress Notes (Signed)
Reviewed procedure-laparoscopy, possible laparotomy, right oophorectomy, possible laparotomy. ?All questions answered ?She states she understands and agrees. ?

## 2021-09-12 NOTE — Op Note (Signed)
NAME: Tracey Avila, Tracey Avila ?MEDICAL RECORD NO: [id number] ?ACCOUNT NO: [id number] ?DATE OF BIRTH: [birth year] ?FACILITY: [facility] ?LOCATION: [facility] ?PHYSICIAN: Daleen Bo. Lyn Hollingshead, MD ? ?Operative Report  ? ?DATE OF PROCEDURE: 09/12/2021 ? ?PREOPERATIVE DIAGNOSIS:  Right dermoid cyst. ? ?POSTOPERATIVE DIAGNOSIS:  Right dermoid cyst. ? ?PROCEDURE:  Laparoscopic right oophorectomy. ? ?SURGEON:  Daleen Bo. Lyn Hollingshead, MD ? ?ASSISTANT:  None. ? ?ANESTHESIA:  General endotracheal intubation. ? ?ESTIMATED BLOOD LOSS:  20 mL ? ?SPECIMENS:  Right ovarian aspirate and right ovary to pathology. ? ?INDICATIONS AND CONSENT:  This patient is a 36 year old patient with a right ovarian cyst, suspicious for dermoid.  Laparoscopy, possible laparotomy, right oophorectomy, possible right salpingectomy has been discussed preoperatively.  Potential risks and ? complications were reviewed preoperatively including but not limited to infection, organ damage, bleeding requiring transfusion of blood products with HIV and hepatitis acquisition, DVT, PE, pneumonia.  A 25% incidence of contralateral dermoid has also  ?been discussed.  The patient states she understands and agrees and consent is signed on the chart. ? ?FINDINGS: Upper abdomen was grossly normal.  Appendix was normal.  Uterus was normal in size and contour.  Left tube and ovary were normal.  Right fallopian tube was normal and the right ovary was occupied by approximately 9 cm smooth cyst.  There were  ?no excrescences, no adhesions and the peritoneal surfaces were smooth throughout. ? ?DESCRIPTION OF PROCEDURE:  The patient was taken to the operating room where she was identified, placed in the dorsal supine position and general anesthesia was induced via endotracheal intubation.  She was then placed in the dorsal lithotomy position.   ?She was prepped vaginally with Betadine.  Foley catheter was placed and prepped abdominally with DuraPrep.  Timeout undertaken.  After 3-minute drying  time, Hulka tenaculum was placed.  Uterus was then manipulated and she was draped in a sterile fashion. ?  The infraumbilical and suprapubic areas were injected with approximately 10 mL of 0.5% plain Marcaine.  An infraumbilical incision was made and a disposable Veress needle was placed on the first attempt without difficulty.  A good syringe and drop test ? were noted.  Two liters of gas were insufflated under low pressure with good tympany in the right upper quadrant.  Veress needle was removed and a 10/11 XL bladeless disposable trocar sleeve was placed using a diagnostic scope.  After placement, the  ?operative scope was used.  A small suprapubic incision was made and a 5 mm disposable trocar sleeve was placed under direct visualization without difficulty.  After noting the findings and noting that this was well clear of the pelvic sidewall and the  ?course of the ureter, the EnSeal bipolar cautery cutting instrument was used to take down the right infundibulopelvic ligament, coming across the ovary, freeing it completely. Good hemostasis was maintained.  Then, using the 5 mm laparoscope through the  ?suprapubic trocar sleeve, the laparoscopic bag was placed through the umbilical trocar sleeve to scoop the ovary.  This was brought up to the subumbilical incision.  The trocar sleeve was removed and this elevates the bag up toward the incision.  With  ?careful observation from below to assure no damage to surrounding structures, a spinal needle and 30 mL syringe were used to aspirate approximately 70 mL of bright yellow thick aspirate.  This decompresses the ovary substantially.  The fascia of the  ?subumbilical incision was slightly extended bilaterally carefully noting with the 5 mm laparoscope again to assure no damage to  surrounding structures noted.  This allows the bag and ovary to be removed intact.  The subumbilical trocar sleeve was then  ?replaced and using the operative scope, copious irrigation was  carried out of approximately 1500 mL. It all returned as clear.  Reinspection and reduced pneumoperitoneum reveals excellent hemostasis at that point of the oophorectomy.  The remaining  ?approximately 23 mL of 0.5% plain Marcaine was instilled into the peritoneal cavity.  Suprapubic trocar sleeve was removed and the umbilical trocar sleeve was removed, reducing the pneumoperitoneum.  Then, under very careful observation and good  ?visualization, the fascia of the subumbilical incision was closed in a pursestring fashion with a 0 Vicryl.  Palpation reveals good secure closure.  The subcutaneous layer of the subumbilical incision was closed with a single 0 Vicryl and the skin on  ?both incisions was closed with interrupted 4-0 Vicryl.  Dermabond was placed on both incisions.  Hulka tenaculum was removed.  All counts were correct.  The patient was taken to recovery room in stable condition. ? ? ? ? ? ?PAA ?D: 09/12/2021 9:14:27 am T: 09/12/2021 10:17:00 pm  ?JOB: [id number]/ [id number]  ?

## 2021-09-12 NOTE — Transfer of Care (Signed)
Immediate Anesthesia Transfer of Care Note ? ?Patient: Tracey Avila ? ?Procedure(s) Performed: Procedure(s) (LRB): ?LAPAROSCOPY DIAGNOSTIC (N/A) ?OOPHORECTOMY (Right) ? ?Patient Location: PACU ? ?Anesthesia Type: General ? ?Level of Consciousness: awake, oriented, sedated and patient cooperative ? ?Airway & Oxygen Therapy: Patient Spontanous Breathing and Patient connected to face mask oxygen ? ?Post-op Assessment: Report given to PACU RN and Post -op Vital signs reviewed and stable ? ?Post vital signs: Reviewed and stable ? ?Complications: No apparent anesthesia complications ?Last Vitals:  ?Vitals Value Taken Time  ?BP 161/97 09/12/21 0917  ?Temp    ?Pulse 94 09/12/21 0920  ?Resp 16 09/12/21 0920  ?SpO2 100 % 09/12/21 0920  ?Vitals shown include unvalidated device data. ? ?Last Pain:  ?Vitals:  ? 09/12/21 0559  ?TempSrc: Oral  ?PainSc: 3   ?   ? ?Patients Stated Pain Goal: 3 (09/12/21 0559) ? ?Complications: No notable events documented. ?

## 2021-09-12 NOTE — Anesthesia Procedure Notes (Signed)
Procedure Name: Intubation ?Date/Time: 09/12/2021 7:35 AM ?Performed by: Suan Halter, CRNA ?Pre-anesthesia Checklist: Patient identified, Emergency Drugs available, Suction available and Patient being monitored ?Patient Re-evaluated:Patient Re-evaluated prior to induction ?Oxygen Delivery Method: Circle system utilized ?Preoxygenation: Pre-oxygenation with 100% oxygen ?Induction Type: IV induction ?Ventilation: Mask ventilation without difficulty ?Laryngoscope Size: Mac and 3 ?Grade View: Grade I ?Tube type: Oral ?Tube size: 7.0 mm ?Number of attempts: 1 ?Airway Equipment and Method: Stylet and Oral airway ?Placement Confirmation: ETT inserted through vocal cords under direct vision, positive ETCO2 and breath sounds checked- equal and bilateral ?Secured at: 22 cm ?Tube secured with: Tape ?Dental Injury: Teeth and Oropharynx as per pre-operative assessment  ? ? ? ? ?

## 2021-09-13 ENCOUNTER — Encounter (HOSPITAL_BASED_OUTPATIENT_CLINIC_OR_DEPARTMENT_OTHER): Payer: Self-pay | Admitting: Obstetrics and Gynecology

## 2021-09-13 LAB — SURGICAL PATHOLOGY

## 2021-12-11 ENCOUNTER — Ambulatory Visit (HOSPITAL_COMMUNITY)
Admission: EM | Admit: 2021-12-11 | Discharge: 2021-12-11 | Disposition: A | Discharge status: Home / Self Care | Payer: BC Managed Care – PPO | Attending: Physician Assistant | Admitting: Physician Assistant

## 2021-12-11 ENCOUNTER — Encounter (HOSPITAL_COMMUNITY): Payer: Self-pay

## 2021-12-11 DIAGNOSIS — R509 Fever, unspecified: Secondary | ICD-10-CM | POA: Diagnosis present

## 2021-12-11 DIAGNOSIS — R051 Acute cough: Secondary | ICD-10-CM | POA: Diagnosis present

## 2021-12-11 DIAGNOSIS — J029 Acute pharyngitis, unspecified: Secondary | ICD-10-CM | POA: Insufficient documentation

## 2021-12-11 DIAGNOSIS — J069 Acute upper respiratory infection, unspecified: Secondary | ICD-10-CM | POA: Insufficient documentation

## 2021-12-11 LAB — POCT RAPID STREP A, ED / UC: Streptococcus, Group A Screen (Direct): NEGATIVE

## 2021-12-11 NOTE — ED Triage Notes (Signed)
Onset of Fever since this past Wednesday. Taking tylenol but the fever keeps returning. Highest was 102 farenheit. Sore throat for the last 4 days, Patient states it feels like irritation from drainage. Hurts to swallow.  Right ear pain started yesterday,   No known sick exposure, negative home Covid test.

## 2021-12-11 NOTE — Discharge Instructions (Addendum)
Advised to continue Tylenol alternating with Motrin for fever and pain. Advised to use salt water gargles and lozenges to help soothe the pain sore throat. Advised that the throat culture will take 48 hours to complete, if positive for strep the nurse will call and issue an antibiotic. Advised to follow-up PCP or return to urgent care if symptoms fail to improve.

## 2021-12-11 NOTE — ED Provider Notes (Signed)
[facility]    CSN: [id number] Arrival date & time: 12/11/21  0818      History   Chief Complaint Chief Complaint  Patient presents with   Fever   Sore Throat   Otalgia   Cough    HPI Tracey Avila is a 36 y.o. female.   36 year old female presents with sore throat and mild fever.  Patient relates for the past 5 days she has been having persistent sore throat, painful swallowing.  She also indicates that she has had fever which ranges anywhere from 99-1 02.  Patient relates that the throat really hurts when she swallows.  Patient has also had some upper respiratory congestion with nasal congestion, postnasal drip, and ear pain and pressure.  Patient relates she is also had some intermittent chest congestion and cough, with production being clear to yellow.  Patient indicates she has not been around any family and friends that have been sick, and she has not had any recent travel.  Patient has been taking Tylenol with minimal relief.  Patient without shortness of breath, no nausea or vomiting.   Fever Associated symptoms: cough, ear pain and sore throat   Sore Throat  Otalgia Associated symptoms: cough, fever and sore throat   Cough Associated symptoms: ear pain, fever and sore throat     Past Medical History:  Diagnosis Date   Anxiety    Asthma    Burning chest pain    Chest pain    Dermoid cyst    Right ovary   GERD (gastroesophageal reflux disease)    Left arm numbness    Mid back pain    Pericarditis    Pericarditis    Syncope     Patient Active Problem List   Diagnosis Date Noted   Syncope 09/07/2021   Anxiety 06/23/2021   GAD (generalized anxiety disorder) 06/23/2021   Asthma 05/30/2021   Abnormal auditory perception of left ear 02/10/2016   Tinnitus, left ear 02/10/2016    Past Surgical History:  Procedure Laterality Date   LAPAROSCOPY N/A 09/12/2021   Procedure: LAPAROSCOPY DIAGNOSTIC;  Surgeon: Everlene Farrier, MD;  Location: [facility];  Service: Gynecology;  Laterality: N/A;   OOPHORECTOMY Right 09/12/2021   Procedure: OOPHORECTOMY;  Surgeon: Everlene Farrier, MD;  Location: [facility];  Service: Gynecology;  Laterality: Right;   TONSILLECTOMY     WISDOM TOOTH EXTRACTION      OB History   No obstetric history on file.      Home Medications    Prior to Admission medications   Medication Sig Start Date End Date Taking? Authorizing Provider  acetaminophen (TYLENOL) 500 MG tablet Take 1,000 mg by mouth as needed.   Yes [provider]  LORazepam (ATIVAN) 0.5 MG tablet Take 1 mg by mouth daily as needed. 03/19/21  Yes [provider]  albuterol (VENTOLIN HFA) 108 (90 Base) MCG/ACT inhaler Inhale 2 puffs into the lungs every 4 (four) hours as needed. 01/18/21   [provider]  EPINEPHrine 0.3 mg/0.3 mL IJ SOAJ injection Inject into the muscle as directed. 01/18/21   [provider]  Homeopathic Products [facility] COLD REMEDY NA) Place into the nose as needed.    [provider]  mirtazapine (REMERON) 15 MG tablet Take 15 mg by mouth at bedtime. 1/2 tab for first week then goes to whole tab starting 06-01-2021    [provider]    Family History Family History  Problem Relation Age  of Onset   Hypertension Father    Hyperlipidemia Father     Social History Social History   Tobacco Use   Smoking status: Never   Smokeless tobacco: Never  Vaping Use   Vaping Use: Never used  Substance Use Topics   Alcohol use: Yes    Comment: socially   Drug use: No     Allergies   Bee venom and Wasp venom   Review of Systems Review of Systems  Constitutional:  Positive for fever.  HENT:  Positive for ear pain and sore throat.   Respiratory:  Positive for cough.      Physical Exam Triage Vital Signs ED Triage Vitals  Enc Vitals Group     BP 12/11/21 0856 121/83     Pulse Rate 12/11/21 0856 73     Resp 12/11/21 0856 16     Temp  12/11/21 0856 98.8 F (37.1 C)     Temp Source 12/11/21 0856 Oral     SpO2 12/11/21 0856 95 %     Weight 12/11/21 0857 190 lb (86.2 kg)     Height 12/11/21 0857 '5\' 6"'$  (1.676 m)     Head Circumference --      Peak Flow --      Pain Score 12/11/21 0857 5     Pain Loc --      Pain Edu? --      Excl. in [place]? --    No data found.  Updated Vital Signs BP 121/83 (BP Location: Left Arm)   Pulse 73   Temp 98.8 F (37.1 C) (Oral)   Resp 16   Ht '5\' 6"'$  (1.676 m)   Wt 190 lb (86.2 kg)   LMP 12/08/2021 (Within Weeks)   SpO2 95%   BMI 30.67 kg/m   Visual Acuity Right Eye Distance:   Left Eye Distance:   Bilateral Distance:    Right Eye Near:   Left Eye Near:    Bilateral Near:     Physical Exam Constitutional:      Appearance: She is well-developed.  HENT:     Right Ear: Tympanic membrane and ear canal normal.     Left Ear: Tympanic membrane and ear canal normal.     Mouth/Throat:     Mouth: Mucous membranes are moist.     Pharynx: Oropharynx is clear. No pharyngeal swelling or posterior oropharyngeal erythema.  Neck:     Comments: Neck: There is mild anterior cervical adenopathy present bilaterally. Cardiovascular:     Rate and Rhythm: Normal rate and regular rhythm.     Heart sounds: Normal heart sounds.  Pulmonary:     Effort: Pulmonary effort is normal.     Breath sounds: Normal breath sounds and air entry. No wheezing, rhonchi or rales.  Neurological:     Mental Status: She is alert.      UC Treatments / Results  Labs (all labs ordered are listed, but only abnormal results are displayed) Labs Reviewed  CULTURE, GROUP A STREP [facility])  POCT RAPID STREP A, ED / UC    EKG   Radiology No results found.  Procedures Procedures (including critical care time)  Medications Ordered in UC Medications - No data to display  Initial Impression / Assessment and Plan / UC Course  I have reviewed the triage vital signs and the nursing notes.  Pertinent labs &  imaging results that were available during my care of the patient were reviewed by me and considered in my  medical decision making (see chart for details).    Plan: 1.  Patient advised to continue salt water gargles and lozenges to help soothe the sore throat. 2.  Patient advised to take Tylenol to help control fever and pain. 3.  Patient advised that throat culture is pending and will take 48 hours until results are completed.  Patient advised she will be contacted if culture is positive for strep. .  Patient advised to follow-up PCP or return to urgent care if symptoms fail to improve Final Clinical Impressions(s) / UC Diagnoses   Final diagnoses:  Viral upper respiratory tract infection  Acute cough  Sore throat  Fever, unspecified     Discharge Instructions      Advised to continue Tylenol alternating with Motrin for fever and pain. Advised to use salt water gargles and lozenges to help soothe the pain sore throat. Advised that the throat culture will take 48 hours to complete, if positive for strep the nurse will call and issue an antibiotic. Advised to follow-up PCP or return to urgent care if symptoms fail to improve.    ED Prescriptions   None    PDMP not reviewed this encounter.   Nyoka Lint, PA-C 12/11/21 [id number]

## 2021-12-14 LAB — CULTURE, GROUP A STREP (THRC)

## 2022-01-04 ENCOUNTER — Ambulatory Visit (INDEPENDENT_AMBULATORY_CARE_PROVIDER_SITE_OTHER): Payer: BC Managed Care – PPO | Admitting: Nurse Practitioner

## 2022-01-04 ENCOUNTER — Other Ambulatory Visit: Payer: Self-pay | Admitting: Nurse Practitioner

## 2022-01-04 VITALS — BP 130/80 | HR 80 | Temp 98.6°F | Ht 66.0 in | Wt 194.0 lb

## 2022-01-04 DIAGNOSIS — Z6831 Body mass index (BMI) 31.0-31.9, adult: Secondary | ICD-10-CM | POA: Insufficient documentation

## 2022-01-04 DIAGNOSIS — Z136 Encounter for screening for cardiovascular disorders: Secondary | ICD-10-CM

## 2022-01-04 DIAGNOSIS — Z13220 Encounter for screening for lipoid disorders: Secondary | ICD-10-CM | POA: Diagnosis not present

## 2022-01-04 DIAGNOSIS — Z113 Encounter for screening for infections with a predominantly sexual mode of transmission: Secondary | ICD-10-CM

## 2022-01-04 DIAGNOSIS — E669 Obesity, unspecified: Secondary | ICD-10-CM | POA: Diagnosis not present

## 2022-01-04 DIAGNOSIS — R21 Rash and other nonspecific skin eruption: Secondary | ICD-10-CM

## 2022-01-04 DIAGNOSIS — Z0001 Encounter for general adult medical examination with abnormal findings: Secondary | ICD-10-CM | POA: Diagnosis not present

## 2022-01-04 DIAGNOSIS — Z1159 Encounter for screening for other viral diseases: Secondary | ICD-10-CM

## 2022-01-04 DIAGNOSIS — R079 Chest pain, unspecified: Secondary | ICD-10-CM

## 2022-01-04 DIAGNOSIS — L709 Acne, unspecified: Secondary | ICD-10-CM

## 2022-01-04 LAB — LIPID PANEL
Cholesterol: 165 mg/dL (ref 0–200)
HDL: 34.2 mg/dL — ABNORMAL LOW (ref >39.00)
LDL Cholesterol: 97 mg/dL (ref 0–99)
NonHDL: 131.27
Total CHOL/HDL Ratio: 5
Triglycerides: 172 mg/dL — ABNORMAL HIGH (ref 0.0–149.0)
VLDL: 34.4 mg/dL (ref 0.0–40.0)

## 2022-01-04 LAB — CBC
HCT: 39 % (ref 36.0–46.0)
Hemoglobin: 13 g/dL (ref 12.0–15.0)
MCHC: 33.4 g/dL (ref 30.0–36.0)
MCV: 87.8 fl (ref 78.0–100.0)
Platelets: 223 10*3/uL (ref 150.0–400.0)
RBC: 4.45 Mil/uL (ref 3.87–5.11)
RDW: 13.3 % (ref 11.5–15.5)
WBC: 9.6 10*3/uL (ref 4.0–10.5)

## 2022-01-04 LAB — URINALYSIS WITH CULTURE, IF INDICATED
Bilirubin Urine: NEGATIVE
Hgb urine dipstick: NEGATIVE
Ketones, ur: NEGATIVE
Leukocytes,Ua: NEGATIVE
Nitrite: NEGATIVE
Specific Gravity, Urine: 1.01 (ref 1.000–1.030)
Total Protein, Urine: NEGATIVE
Urine Glucose: NEGATIVE
Urobilinogen, UA: 0.2 (ref 0.0–1.0)
WBC, UA: NONE SEEN (ref 0–?)
pH: 6 (ref 5.0–8.0)

## 2022-01-04 LAB — COMPREHENSIVE METABOLIC PANEL
ALT: 17 U/L (ref 0–35)
AST: 17 U/L (ref 0–37)
Albumin: 4.5 g/dL (ref 3.5–5.2)
Alkaline Phosphatase: 54 U/L (ref 39–117)
BUN: 10 mg/dL (ref 6–23)
CO2: 27 mEq/L (ref 19–32)
Calcium: 9.2 mg/dL (ref 8.4–10.5)
Chloride: 105 mEq/L (ref 96–112)
Creatinine, Ser: 0.76 mg/dL (ref 0.40–1.20)
GFR: 102.23 mL/min (ref >60.00)
Glucose, Bld: 89 mg/dL (ref 70–99)
Potassium: 3.8 mEq/L (ref 3.5–5.1)
Sodium: 139 mEq/L (ref 135–145)
Total Bilirubin: 0.5 mg/dL (ref 0.2–1.2)
Total Protein: 7.4 g/dL (ref 6.0–8.3)

## 2022-01-04 LAB — HEMOGLOBIN A1C: Hgb A1c MFr Bld: 5.6 % (ref 4.6–6.5)

## 2022-01-04 LAB — TSH: TSH: 1.37 u[IU]/mL (ref 0.35–5.50)

## 2022-01-04 MED ORDER — CLOTRIMAZOLE 1 % EX OINT: 1.0000 | TOPICAL_OINTMENT | Freq: Two times a day (BID) | CUTANEOUS | 2 refills | Status: DC

## 2022-01-04 NOTE — Assessment & Plan Note (Signed)
We will trial clotrimazole application twice a day, refer patient to dermatology for assistance with evaluation and management.

## 2022-01-04 NOTE — Assessment & Plan Note (Signed)
Recommend patient follow healthy diet and participate in at least 150 minutes of exercise per week.  She was encouraged to follow-up with OB/GYN to get Pap smear completed.  We will complete STI and hep C screening today.  Further blood work also ordered for screenings including lipid panel, A1c, TSH, CMP, and CBC.  Further recommendations may be made based upon these results.

## 2022-01-04 NOTE — Progress Notes (Signed)
Established Patient Office Visit  Subjective   Patient ID: Tracey Avila, female    DOB: 06/23/1985  Age: 36 y.o. MRN: [id number]  Chief Complaint  Patient presents with   Returning patient    Discuss BP, cardio would like to monitor    Health Maintenance: Patient due for Pap smear, she reports that she is preparing to get this scheduled with her OB/GYN.  She is due for STI screening and hepatitis C screening.  Up-to-date with tetanus vaccine.  She reports that she has labile blood pressure, and has noticed chest pain intermittently.  She has a history of pericarditis and feels that the intermittent pain is similar to that of when she had her pericarditis.  She is not experiencing shortness of breath.  She would like to be evaluated by her cardiologist regarding her blood pressure and chest pain.  She also reports a rash under her breast folds that seems to come and go.  She has treated in the past with cream for eczema, not sure of the name of cream.  She reapplied this cream and rash has not improved.  It is not pruritic.  She also has acne and would like to be evaluated by dermatology for treatment of her acne.    Review of Systems  Constitutional:  Negative for fever (had an elevated fever a month ago with high blood pressure as well), malaise/fatigue and weight loss.  Respiratory:  Negative for cough and shortness of breath.   Cardiovascular:  Positive for chest pain (a couple of times a week). Negative for palpitations.  Gastrointestinal:  Negative for abdominal pain and blood in stool.  Neurological:  Negative for dizziness and loss of consciousness.  Psychiatric/Behavioral:  Negative for depression and suicidal ideas.       Objective:     BP 130/80 (BP Location: Right Arm, Patient Position: Sitting, Cuff Size: Large)   Pulse 80   Temp 98.6 F (37 C) (Oral)   Ht '5\' 6"'$  (1.676 m)   Wt 194 lb (88 kg)   LMP 12/08/2021 (Within Weeks)   SpO2 99%   BMI 31.31 kg/m  BP  Readings from Last 3 Encounters:  01/04/22 130/80  12/11/21 121/83  09/12/21 121/80   Wt Readings from Last 3 Encounters:  01/04/22 194 lb (88 kg)  12/11/21 190 lb (86.2 kg)  09/12/21 193 lb 14.4 oz (88 kg)      Physical Exam Vitals reviewed. Exam conducted with a chaperone present.  Constitutional:      Appearance: Normal appearance.  HENT:     Head: Normocephalic and atraumatic.     Right Ear: Tympanic membrane, ear canal and external ear normal.     Left Ear: Tympanic membrane, ear canal and external ear normal.  Eyes:     General:        Right eye: No discharge.        Left eye: No discharge.     Extraocular Movements: Extraocular movements intact.     Conjunctiva/sclera: Conjunctivae normal.     Pupils: Pupils are equal, round, and reactive to light.  Neck:     Vascular: No carotid bruit.  Cardiovascular:     Rate and Rhythm: Normal rate and regular rhythm.     Pulses: Normal pulses.     Heart sounds: Normal heart sounds. No murmur heard. Pulmonary:     Effort: Pulmonary effort is normal.     Breath sounds: Normal breath sounds.  Chest:  Breasts:  Breasts are symmetrical.     Right: Normal.     Left: Normal.  Abdominal:     General: Abdomen is flat. Bowel sounds are normal. There is no distension.     Palpations: Abdomen is soft. There is no mass.     Tenderness: There is no abdominal tenderness.  Musculoskeletal:        General: No tenderness.     Cervical back: Neck supple. No muscular tenderness.     Right lower leg: No edema.     Left lower leg: No edema.  Lymphadenopathy:     Cervical: No cervical adenopathy.     Upper Body:     Right upper body: No supraclavicular adenopathy.     Left upper body: No supraclavicular adenopathy.  Skin:    General: Skin is warm and dry.  Neurological:     General: No focal deficit present.     Mental Status: She is alert and oriented to person, place, and time.     Motor: No weakness.     Gait: Gait normal.   Psychiatric:        Mood and Affect: Mood normal.        Behavior: Behavior normal.        Judgment: Judgment normal.      No results found for any visits on 01/04/22.    The ASCVD Risk score (Arnett DK, et al., 2019) failed to calculate for the following reasons:   The 2019 ASCVD risk score is only valid for ages 51 to 11    Assessment & Plan:   Problem List Items Addressed This Visit       Musculoskeletal and Integument   Rash    We will trial clotrimazole application twice a day, refer patient to dermatology for assistance with evaluation and management.       Relevant Medications   Clotrimazole 1 % OINT   Other Relevant Orders   Ambulatory referral to Dermatology   Acne    Referral to dermatology      Relevant Orders   Ambulatory referral to Dermatology     Other   Encounter for general adult medical examination with abnormal findings - Primary    Recommend patient follow healthy diet and participate in at least 150 minutes of exercise per week.  She was encouraged to follow-up with OB/GYN to get Pap smear completed.  We will complete STI and hep C screening today.  Further blood work also ordered for screenings including lipid panel, A1c, TSH, CMP, and CBC.  Further recommendations may be made based upon these results.      Relevant Orders   TSH   Hemoglobin A1c   Lipid panel   Comprehensive metabolic panel   CBC   HIV Antibody (routine testing w rflx)   RPR   GC/Chlamydia Probe Amp   Urinalysis with Culture, if indicated   Hepatitis C antibody   Class 1 obesity without serious comorbidity with body mass index (BMI) of 31.0 to 31.9 in adult    Blood work ordered today, Further recommendations may be made based on these results.        Relevant Orders   TSH   Hemoglobin A1c   Lipid panel   Comprehensive metabolic panel   CBC   HIV Antibody (routine testing w rflx)   RPR   GC/Chlamydia Probe Amp   Urinalysis with Culture, if indicated    Hepatitis C antibody   Other Visit Diagnoses  Encounter for lipid screening for cardiovascular disease       Relevant Orders   TSH   Hemoglobin A1c   Lipid panel   Comprehensive metabolic panel   CBC   HIV Antibody (routine testing w rflx)   RPR   GC/Chlamydia Probe Amp   Urinalysis with Culture, if indicated   Hepatitis C antibody   Routine screening for STI (sexually transmitted infection)       Relevant Orders   TSH   Hemoglobin A1c   Lipid panel   Comprehensive metabolic panel   CBC   HIV Antibody (routine testing w rflx)   RPR   GC/Chlamydia Probe Amp   Urinalysis with Culture, if indicated   Hepatitis C antibody   Encounter for hepatitis C screening test for low risk patient       Relevant Orders   TSH   Hemoglobin A1c   Lipid panel   Comprehensive metabolic panel   CBC   HIV Antibody (routine testing w rflx)   RPR   GC/Chlamydia Probe Amp   Urinalysis with Culture, if indicated   Hepatitis C antibody       Return in about 6 months (around 07/07/2022) for f/u with Judson Roch; annual physical in 1 year.    Ailene Ards, NP

## 2022-01-04 NOTE — Patient Instructions (Signed)
Clotrimazole -- apply to affected area twice a day for 14 days

## 2022-01-04 NOTE — Assessment & Plan Note (Signed)
Blood work ordered today, Further recommendations may be made based on these results.

## 2022-01-04 NOTE — Assessment & Plan Note (Signed)
Referral to dermatology.  

## 2022-01-05 LAB — HIV ANTIBODY (ROUTINE TESTING W REFLEX): HIV 1&2 Ab, 4th Generation: NONREACTIVE

## 2022-01-05 LAB — RPR: RPR Ser Ql: NONREACTIVE

## 2022-01-05 LAB — HEPATITIS C ANTIBODY: Hepatitis C Ab: NONREACTIVE

## 2022-01-08 DIAGNOSIS — R079 Chest pain, unspecified: Secondary | ICD-10-CM | POA: Insufficient documentation

## 2022-01-08 NOTE — Assessment & Plan Note (Signed)
She was encouraged to call her cardiologist to discuss her chest pain and determine if additional work up is needed. She reports her understanding.

## 2022-01-09 LAB — GC/CHLAMYDIA PROBE AMP
Chlamydia trachomatis, NAA: NEGATIVE
Neisseria Gonorrhoeae by PCR: NEGATIVE

## 2022-02-16 ENCOUNTER — Encounter: Payer: Self-pay | Admitting: Cardiology

## 2022-02-16 ENCOUNTER — Ambulatory Visit: Payer: BC Managed Care – PPO | Admitting: Cardiology

## 2022-02-16 VITALS — BP 114/73 | HR 58 | Temp 98.1°F | Resp 16 | Ht 66.0 in | Wt 188.8 lb

## 2022-02-16 DIAGNOSIS — M940 Chondrocostal junction syndrome [Tietze]: Secondary | ICD-10-CM

## 2022-02-16 DIAGNOSIS — R0989 Other specified symptoms and signs involving the circulatory and respiratory systems: Secondary | ICD-10-CM

## 2022-02-16 DIAGNOSIS — R079 Chest pain, unspecified: Secondary | ICD-10-CM

## 2022-02-16 NOTE — Progress Notes (Signed)
Primary Physician/Referring:  Ailene Ards, NP  Patient ID: Tracey Avila, female    DOB: [birth year], 36 y.o.   MRN: [id number]  Chief Complaint  Patient presents with   Chest Pain   Hypertension   Follow-up   HPI:    Tracey Avila  is a 36 y.o. 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  She had COVID at that time, mildly abnormal EKG at the time with T wave abnormality in anterolateral leads and suspected pericarditis.  Recently patient has noticed elevated blood pressure, with elevated blood pressure.  She was also having some chest discomfort in the form of heaviness.  She does heavy exertional activity, works in the warehouse and lifts heavy objects and gets >10,000 steps a day without any abnormality or chest pain or dyspnea, dizziness or syncope. Past Medical History:  Diagnosis Date   Anxiety    Asthma    Burning chest pain    Chest pain    Dermoid cyst    Right ovary   GERD (gastroesophageal reflux disease)    Left arm numbness    Mid back pain    Pericarditis    Syncope    Past Surgical History:  Procedure Laterality Date   LAPAROSCOPY N/A 09/12/2021   Procedure: LAPAROSCOPY DIAGNOSTIC;  Surgeon: Everlene Farrier, MD;  Location: [facility];  Service: Gynecology;  Laterality: N/A;   OOPHORECTOMY Right 09/12/2021   Procedure: OOPHORECTOMY;  Surgeon: Everlene Farrier, MD;  Location: [facility];  Service: Gynecology;  Laterality: Right;   TONSILLECTOMY     WISDOM TOOTH EXTRACTION     Family History  Problem Relation Age of Onset   Hypertension Father    Hyperlipidemia Father     Social History   Tobacco Use   Smoking status: Never   Smokeless tobacco: Never  Substance Use  Topics   Alcohol use: Yes    Comment: socially   Marital Status: Single  ROS  Review of Systems  Cardiovascular:  Negative for chest pain, dyspnea on exertion and leg swelling.  Gastrointestinal:  Negative for melena.  Psychiatric/Behavioral:  The patient is nervous/anxious. The patient does not have insomnia.    Objective  Blood pressure 114/73, pulse (!) 58, temperature 98.1 F (36.7 C), temperature source Temporal, resp. rate 16, height '5\' 6"'$  (1.676 m), weight 188 lb 12.8 oz (85.6 kg), SpO2 99 %. Body mass index is 30.47 kg/m.     02/16/2022    9:51 AM 01/04/2022    1:15 PM 12/11/2021    8:57 AM  Vitals with BMI  Height '5\' 6"'$  '5\' 6"'$  '5\' 6"'$   Weight 188 lbs 13 oz 194 lbs 190 lbs  BMI 30.49 08.14 48.18  Systolic 563 149   Diastolic 73 80   Pulse 58 80     Physical Exam Neck:     Vascular: No carotid bruit or JVD.  Cardiovascular:     Rate and Rhythm: Normal rate and regular rhythm.     Pulses: Intact distal pulses.     Heart sounds: Normal heart sounds. No murmur heard.    No gallop.  Pulmonary:     Effort: Pulmonary effort is normal.     Breath sounds: Normal breath sounds.  Chest:  Chest wall: No tenderness.  Abdominal:     General: Bowel sounds are normal.     Palpations: Abdomen is soft.  Musculoskeletal:     Right lower leg: No edema.     Left lower leg: No edema.     Laboratory examination:   Recent Labs    04/03/21 0301 01/04/22 1348  NA 135 139  K 3.6 3.8  CL 102 105  CO2 23 27  GLUCOSE 111* 89  BUN 8 10  CREATININE 0.67 0.76  CALCIUM 9.4 9.2  GFRNONAA >60  --       Latest Ref Rng & Units 01/04/2022    1:48 PM 04/03/2021    3:01 AM  CMP  Glucose 70 - 99 mg/dL 89  111   BUN 6 - 23 mg/dL 10  8   Creatinine 0.40 - 1.20 mg/dL 0.76  0.67   Sodium 135 - 145 mEq/L 139  135   Potassium 3.5 - 5.1 mEq/L 3.8  3.6   Chloride 96 - 112 mEq/L 105  102   CO2 19 - 32 mEq/L 27  23   Calcium 8.4 - 10.5 mg/dL 9.2  9.4   Total Protein 6.0 - 8.3 g/dL 7.4   7.2   Total Bilirubin 0.2 - 1.2 mg/dL 0.5  0.7   Alkaline Phos 39 - 117 U/L 54  49   AST 0 - 37 U/L 17  14   ALT 0 - 35 U/L 17  15       Latest Ref Rng & Units 01/04/2022    1:48 PM 09/12/2021    5:53 AM 04/03/2021    3:01 AM  CBC  WBC 4.0 - 10.5 K/uL 9.6  9.1  14.8   Hemoglobin 12.0 - 15.0 g/dL 13.0  13.8  14.2   Hematocrit 36.0 - 46.0 % 39.0  40.2  42.0   Platelets 150.0 - 400.0 K/uL 223.0  254  260    Lipid Panel     Component Value Date/Time   CHOL 165 01/04/2022 1348   TRIG 172.0 (H) 01/04/2022 1348   HDL 34.20 (L) 01/04/2022 1348   CHOLHDL 5 01/04/2022 1348   VLDL 34.4 01/04/2022 1348   LDLCALC 97 01/04/2022 1348   HEMOGLOBIN A1C Lab Results  Component Value Date   HGBA1C 5.6 01/04/2022   TSH Recent Labs    01/04/22 1348  TSH 1.37    Medications and allergies   Allergies  Allergen Reactions   Bee Venom Anaphylaxis   Wasp Venom Anaphylaxis    Medication list after today's encounter    Current Outpatient Medications:    acetaminophen (TYLENOL) 500 MG tablet, Take 1,000 mg by mouth as needed., Disp: , Rfl:    albuterol (VENTOLIN HFA) 108 (90 Base) MCG/ACT inhaler, Inhale 2 puffs into the lungs every 4 (four) hours as needed., Disp: , Rfl:    EPINEPHrine 0.3 mg/0.3 mL IJ SOAJ injection, Inject into the muscle as directed., Disp: , Rfl:    LORazepam (ATIVAN) 0.5 MG tablet, Take 1 mg by mouth daily as needed., Disp: , Rfl:    Radiology:   CT angio of the chest, abdomen and pelvis 04/03/2021: Cardiovascular: Normal thoracic aorta. No cardiomegaly or pericardial effusion. Central pulmonary arteries appear to be patent. Normal proximal great vessels.  Mediastinum/Nodes: Negative. No mediastinal mass or lymphadenopathy.  Lungs/Pleura: Major airways are patent. Lung volumes are normal and both lungs appear clear. No pneumothorax or pleural effusion. 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 Recommend GYN surgery  follow-up.  Cardiac Studies:   PCV ECHOCARDIOGRAM COMPLETE 04/25/2021  Narrative Echocardiogram 04/25/2021: Normal LV systolic function with visual EF 60-65%. Left ventricle cavity is normal in size. Normal global wall motion. Normal diastolic filling pattern, normal LAP. Mild tricuspid regurgitation. No prior study for comparison.    EKG:   EKG 02/16/2022: Normal sinus rhythm at rate of 72 bpm, normal axis.  Incomplete right bundle branch block.  Nonspecific T wave abnormality, cannot exclude inferior and anteroseptal ischemia.  Normal QT interval.  Abnormal EKG.  Compared to 05/19/2021, T wave inversion in V1 to V3 was noted previously, hence normal variant.  Nonspecific T abnormality in 3 and aVF is new.  EKG 04/24/2021: Normal sinus rhythm at rate of 84 bpm, left atrial enlargement, normal axis.  Incomplete right bundle branch block.  Diffuse 2 mm ST depression with T wave inversion, cannot exclude inferior and anterolateral ischemia.  Normal QT interval.  Abnormal EKG. Compared to 04/12/2021 and 04/03/2021 EKG, diffuse ST-T changes are new- Normal EKG.  Assessment     ICD-10-CM   1. Labile hypertension  R09.89     2. Costochondritis  M94.0     3. Chest pain of uncertain etiology  G28.3 EKG 12-Lead     Medications Discontinued During This Encounter  Medication Reason   Clotrimazole 1 % OINT      No orders of the defined types were placed in this encounter.  Orders Placed This Encounter  Procedures   EKG 12-Lead    Recommendations:   Tracey Avila is a 36 y.o. 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  She had COVID at that time, mildly abnormal EKG at the time with T wave abnormality in anterolateral leads  and suspected pericarditis.  Recently patient has noticed elevated blood pressure, with elevated blood pressure.  She was also having some chest discomfort in the form of heaviness.   I reviewed the EKG, very mild and subtle abnormalities of nonspecific T abnormality in the inferior leads that is new from previous.  Otherwise normal physical examination.  She does have mild obesity but she is also muscular.  She does heavy exertional activity without any chest discomfort.  I suspect she has labile hypertension.  As her blood pressure is very low during the daytime, I would not want to start her on any medications for now.  Goal BMI would be around 27-28 which I suspect will certainly improve her blood pressure.  In view of mildly abnormal EKG, would like to see her back again in a year to follow-up on any other changes that could occur with EKG abnormalities.  She will continue to trend the blood pressure and she can always certainly call us if blood pressure is persistently >66 mmHg diastolic.  I reviewed her lipids, she has dyslipidemia with reduced HDL and elevated triglycerides.  This is in spite of patient being extremely careful with her diet and also heavy exertional activity and exercising at least 1 hour on the treadmill on a daily basis.  I would like to certainly keep an eye on this as well.  I do not   Adrian Prows, MD, [facility] 02/16/2022, 10:31 AM Office: [phone]

## 2022-07-06 ENCOUNTER — Ambulatory Visit (INDEPENDENT_AMBULATORY_CARE_PROVIDER_SITE_OTHER): Payer: 59 | Admitting: Nurse Practitioner

## 2022-07-06 VITALS — BP 98/70 | HR 68 | Temp 98.0°F | Ht 66.0 in | Wt 195.0 lb

## 2022-07-06 DIAGNOSIS — Z6831 Body mass index (BMI) 31.0-31.9, adult: Secondary | ICD-10-CM | POA: Diagnosis not present

## 2022-07-06 DIAGNOSIS — K219 Gastro-esophageal reflux disease without esophagitis: Secondary | ICD-10-CM | POA: Diagnosis not present

## 2022-07-06 DIAGNOSIS — E669 Obesity, unspecified: Secondary | ICD-10-CM

## 2022-07-06 MED ORDER — FAMOTIDINE 20 MG PO TABS: 20.0000 mg | ORAL_TABLET | Freq: Every day | ORAL | 1 refills | Status: DC

## 2022-07-06 NOTE — Assessment & Plan Note (Signed)
Trial daily famotidine for 6 weeks.  See if this helps with her chest heaviness.  If so she can take famotidine and/or Tums as needed.

## 2022-07-06 NOTE — Progress Notes (Signed)
Established Patient Office Visit  Subjective   Patient ID: Tracey Avila, female    DOB: 1986-02-13  Age: 37 y.o. MRN: [id number]  Chief Complaint  Patient presents with   Gastroesophageal Reflux    Since last office visit patient did see cardiology and evaluation was reassuring.  They recommend following up in 1 year to monitor chest pain heaviness that she had been experiencing.  She tells me today that she thinks this is more associated with possible heartburn or gas as she finds when she burps this will result in pain improving.  Has taken Tums as needed for heartburn, but overall chest heaviness has not been occurring as frequently.  She was also encouraged by cardiologist to focus on weight loss.    Review of Systems  Respiratory:  Negative for shortness of breath.   Cardiovascular:  Positive for chest pain. Negative for palpitations.  Gastrointestinal:  Positive for heartburn.  Neurological:  Negative for seizures and loss of consciousness.  Psychiatric/Behavioral:  Negative for suicidal ideas. The patient is nervous/anxious.       Objective:     BP 98/70   Pulse 68   Temp 98 F (36.7 C) (Temporal)   Ht '5\' 6"'$  (1.676 m)   Wt 195 lb (88.5 kg)   SpO2 97%   BMI 31.47 kg/m  BP Readings from Last 3 Encounters:  07/06/22 98/70  02/16/22 114/73  01/04/22 130/80   Wt Readings from Last 3 Encounters:  07/06/22 195 lb (88.5 kg)  02/16/22 188 lb 12.8 oz (85.6 kg)  01/04/22 194 lb (88 kg)      Physical Exam Vitals reviewed.  Constitutional:      General: She is not in acute distress.    Appearance: Normal appearance.  HENT:     Head: Normocephalic and atraumatic.  Neck:     Vascular: No carotid bruit.  Cardiovascular:     Rate and Rhythm: Normal rate and regular rhythm.     Pulses: Normal pulses.     Heart sounds: Normal heart sounds.  Pulmonary:     Effort: Pulmonary effort is normal.     Breath sounds: Normal breath sounds.  Skin:    General: Skin is  warm and dry.  Neurological:     General: No focal deficit present.     Mental Status: She is alert and oriented to person, place, and time.  Psychiatric:        Mood and Affect: Mood normal.        Behavior: Behavior normal.        Judgment: Judgment normal.      No results found for any visits on 07/06/22.    The ASCVD Risk score (Arnett DK, et al., 2019) failed to calculate for the following reasons:   The 2019 ASCVD risk score is only valid for ages 27 to 52    Assessment & Plan:   Problem List Items Addressed This Visit       Digestive   Gastroesophageal reflux disease - Primary    Trial daily famotidine for 6 weeks.  See if this helps with her chest heaviness.  If so she can take famotidine and/or Tums as needed.      Relevant Medications   famotidine (PEPCID) 20 MG tablet     Other   Class 1 obesity without serious comorbidity with body mass index (BMI) of 31.0 to 31.9 in adult    Recommend focus on lifestyle modification aimed at weight loss specifically participating  in 150 minutes of physical activity per week, eating 1100 to 1200 cal/day, drinking 60 ounces of water per day, focusing on low glycemic index foods as main source of calories.        Return in about 6 months (around 01/04/2023) for CPE with Judson Roch.    Ailene Ards, NP

## 2022-07-06 NOTE — Assessment & Plan Note (Signed)
Recommend focus on lifestyle modification aimed at weight loss specifically participating in 150 minutes of physical activity per week, eating 1100 to 1200 cal/day, drinking 60 ounces of water per day, focusing on low glycemic index foods as main source of calories.

## 2022-07-06 NOTE — Patient Instructions (Addendum)
1100-1200 calls/day Track all food in a tracker -- [employer]itness pal is a good free app for phone 60 ounces of water per day 150 mins exercise/week Choose low glycemic index foods

## 2022-07-20 ENCOUNTER — Other Ambulatory Visit: Payer: Self-pay | Admitting: Nurse Practitioner

## 2022-07-20 DIAGNOSIS — K219 Gastro-esophageal reflux disease without esophagitis: Secondary | ICD-10-CM

## 2022-12-15 IMAGING — CT CT ANGIO CHEST-ABD-PELV FOR DISSECTION W/ AND WO/W CM
2 of 7 series · 15 of 46 positions shown, 17 images · non-contrast
Comparison: Chest radiographs 5255 hours today.

CLINICAL DATA: 35-year-old female with chest and back pain.

EXAM:
CT ANGIOGRAPHY CHEST, ABDOMEN AND PELVIS
TECHNIQUE: Non-contrast CT of the chest was initially obtained.

[Series 7: dissection 2mm · axial · 0.88mm/px · z∈[+752,+1330]mm · 12 of 327 slices shown, 14 images]
[im 19/327  soft-tissue]
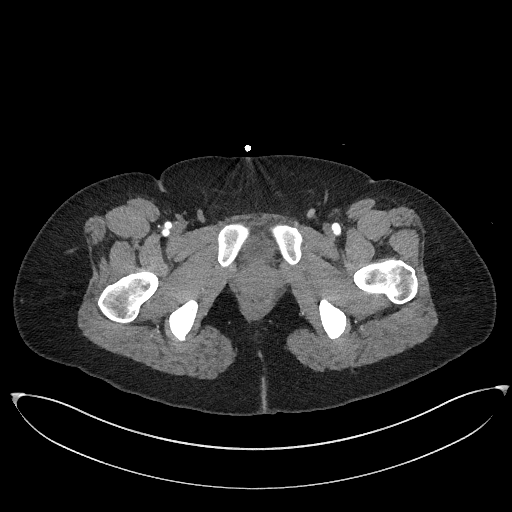
[im 19/327  bone]
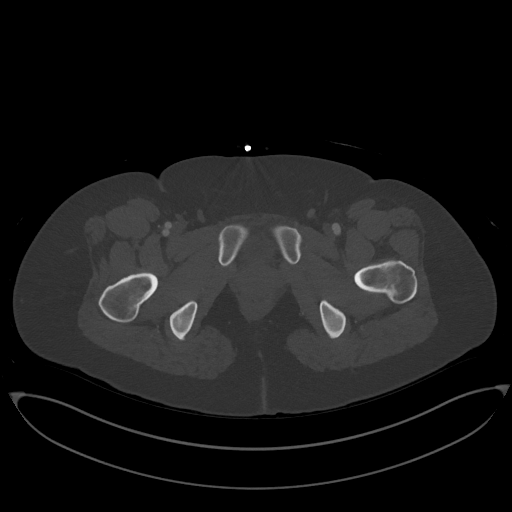
[im 55/327  soft-tissue]
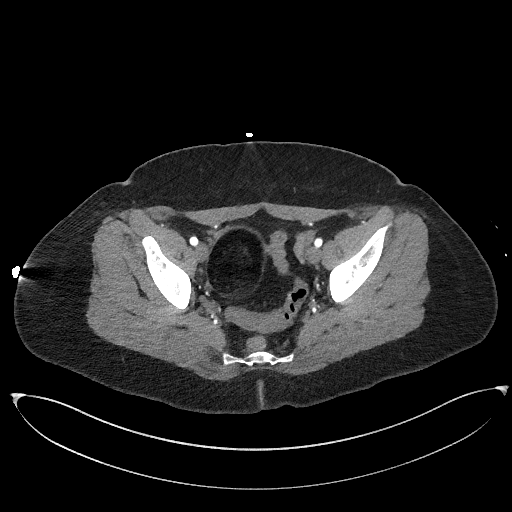
[im 73/327  soft-tissue]
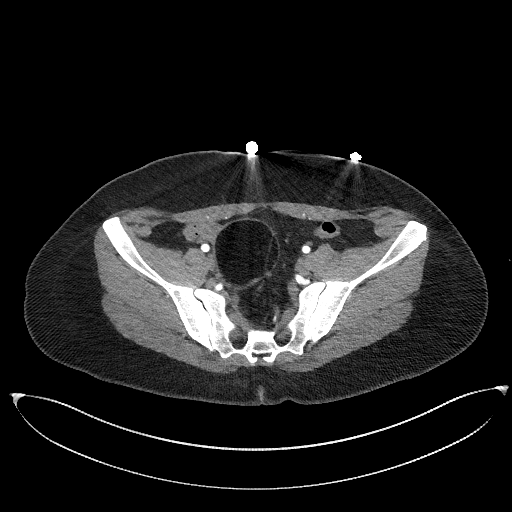
[im 91/327  soft-tissue]
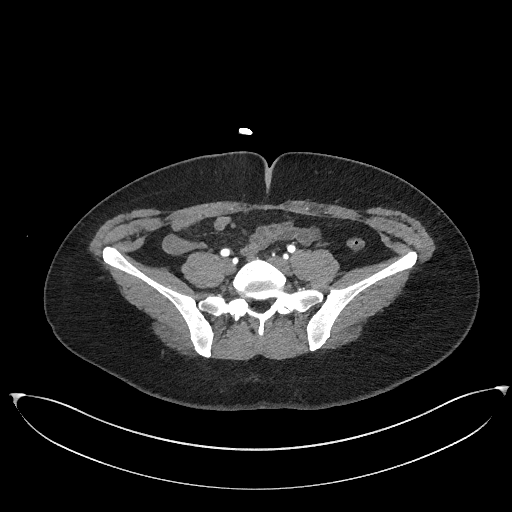
[im 127/327  soft-tissue]
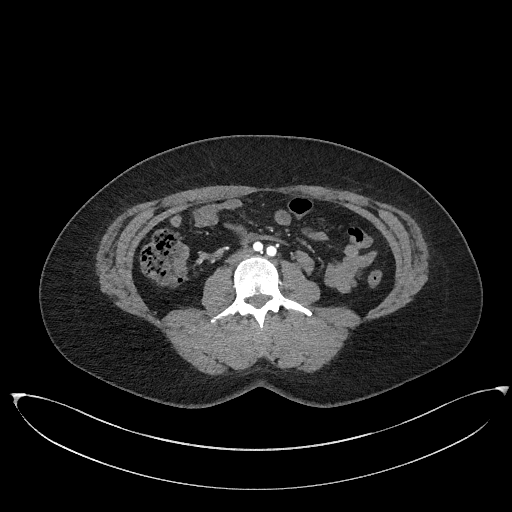
[im 145/327  soft-tissue]
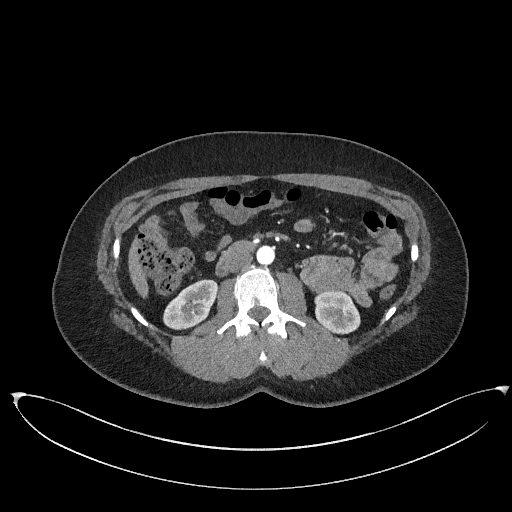
[im 182/327  soft-tissue]
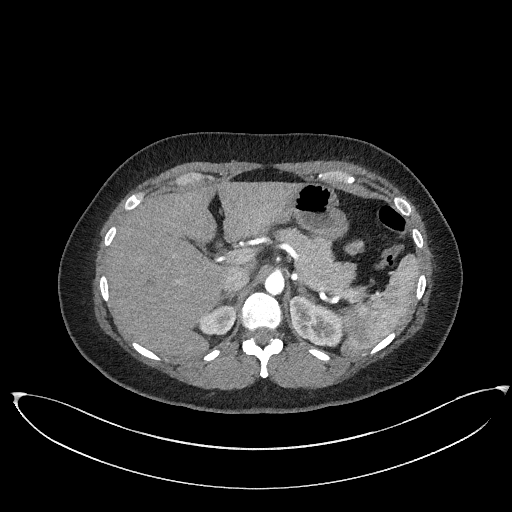
[im 200/327  soft-tissue]
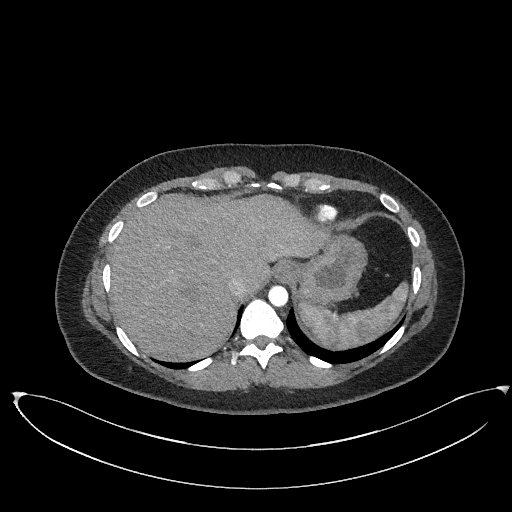
[im 236/327  soft-tissue]
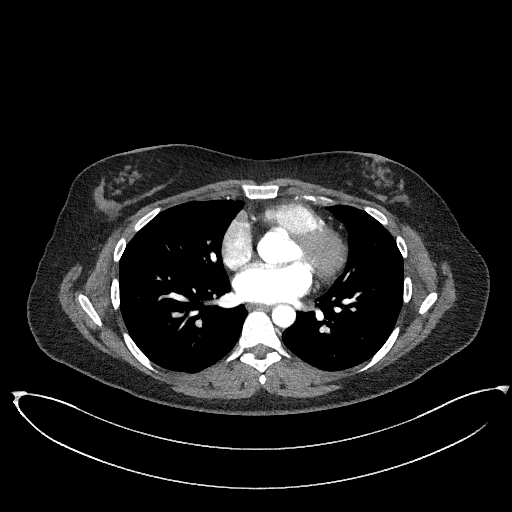
[im 236/327  bone]
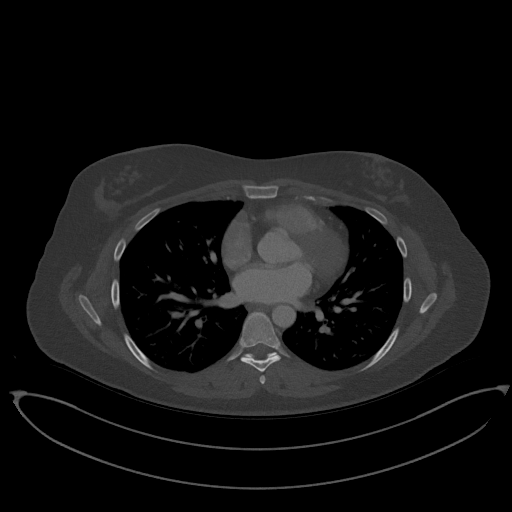
[im 254/327  soft-tissue]
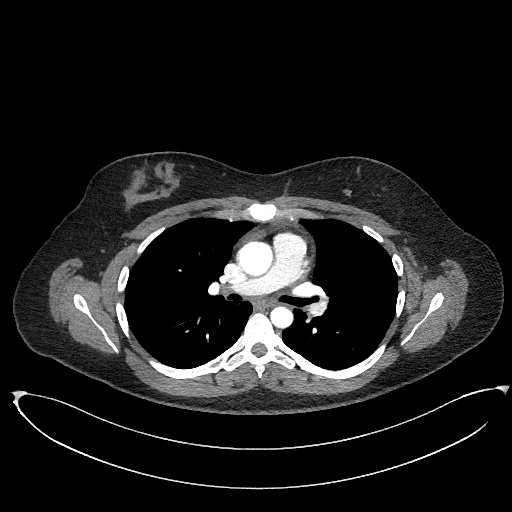
[im 272/327  soft-tissue]
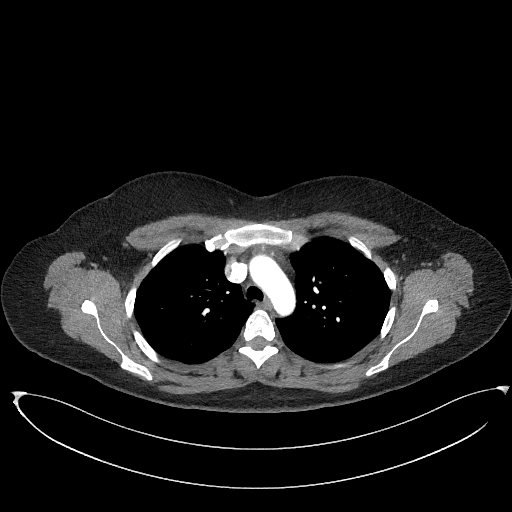
[im 308/327  soft-tissue]
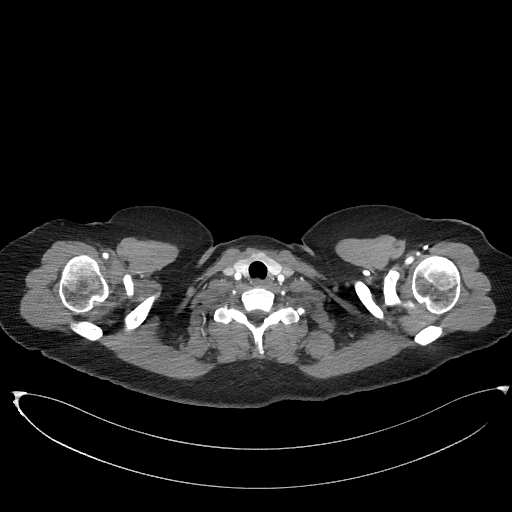

[Series 10: dissection 2mm cor · coronal · 0.85mm/px · 3 of 151 slices shown]
[im 38/151  soft-tissue]
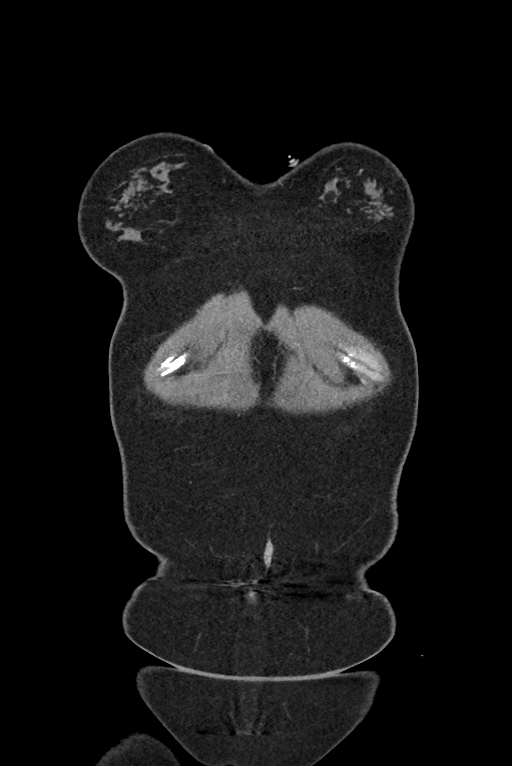
[im 76/151  soft-tissue]
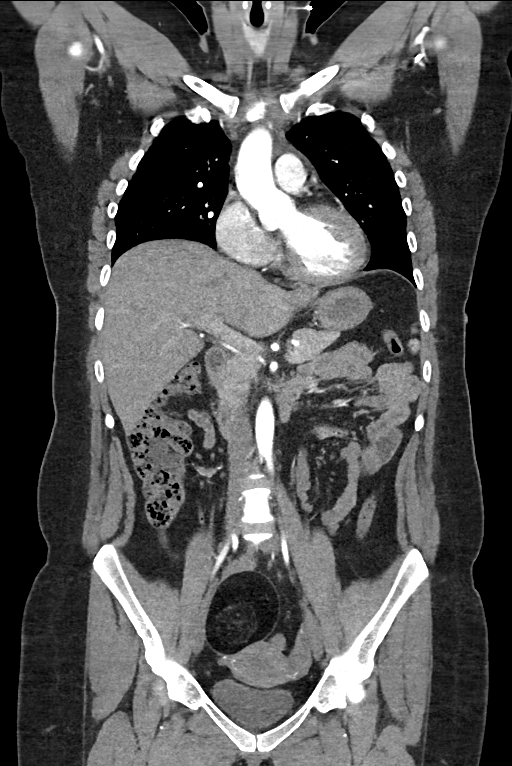
[im 113/151  soft-tissue]
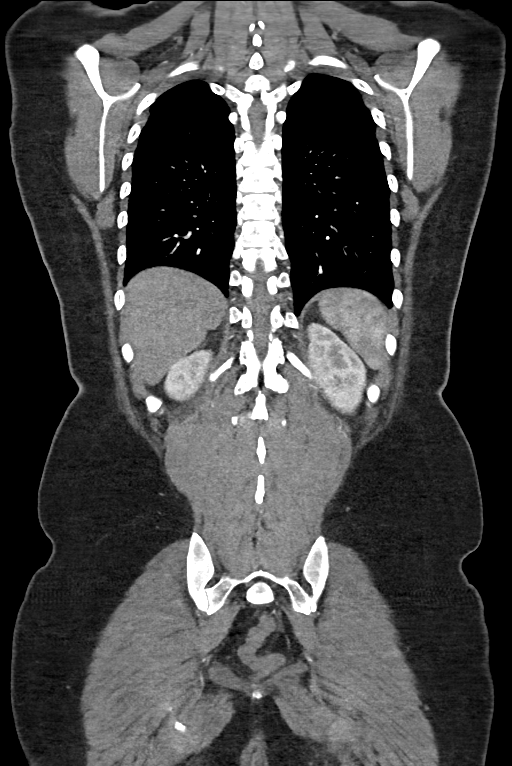

[15 of 46 positions shown; findings below may reference images not displayed]

Multidetector CT imaging through the chest, abdomen and pelvis was
performed using the standard protocol during bolus administration of
intravenous contrast. Multiplanar reconstructed images and MIPs were
obtained and reviewed to evaluate the vascular anatomy.

CONTRAST:  75mL OMNIPAQUE IOHEXOL 350 MG/ML SOLN
FINDINGS: CTA CHEST FINDINGS

Cardiovascular: Normal thoracic aorta. No cardiomegaly or
pericardial effusion. Central pulmonary arteries appear to be
patent. Normal proximal great vessels.

Mediastinum/Nodes: Negative. No mediastinal mass or lymphadenopathy.

Lungs/Pleura: Major airways are patent. Lung volumes are normal and
both lungs appear clear. No pneumothorax or pleural effusion.

Musculoskeletal: Negative.

Review of the MIP images confirms the above findings.

CTA ABDOMEN AND PELVIS FINDINGS

VASCULAR

Normal abdominal aorta and major aortic branches. Bilateral iliac
and visible proximal femoral arteries also patent and normal.

Review of the MIP images confirms the above findings.

NON-VASCULAR

Hepatobiliary: Negative liver.  Contracted and negative gallbladder.

Pancreas: Negative.

Spleen: Negative, physiologic early splenic enhancement.

Adrenals/Urinary Tract: Negative adrenal glands. Kidneys are
symmetric and enhance normally. Negative ureters and bladder. Pelvic
phleboliths but no convincing urinary calculus.

Stomach/Bowel: Negative. Normal appendix on series 7, image 222. No
dilated bowel. Negative stomach and duodenum. No free air, free
fluid or mesenteric inflammation.

Lymphatic: No lymphadenopathy.

Reproductive: Oval up to 7.9 cm diameter predominantly fatty mass
arising from the right adnexa projecting into the midline pelvic
inlet. Macroscopic fat, soft tissue, and also a small volume of
calcified density (series 7, image 251) in keeping with mature
cystic teratoma (ovarian dermoid cyst). Uterus and ovaries otherwise
negative.

Other: No pelvic free fluid.

Musculoskeletal: Negative.

Review of the MIP images confirms the above findings.
IMPRESSION: 1. Positive for a 7.9 cm right ovarian dermoid cyst (mature cystic
teratoma). Recommend GYN surgery follow-up.
2. Otherwise normal CTA chest, abdomen, and pelvis.

## 2022-12-15 IMAGING — DX DG CHEST 2V
2 series · 2 of 2 positions shown · non-contrast
Comparison: 09/12/2016

CLINICAL DATA: Chest pain

EXAM:
CHEST - 2 VIEW

[chest lat]
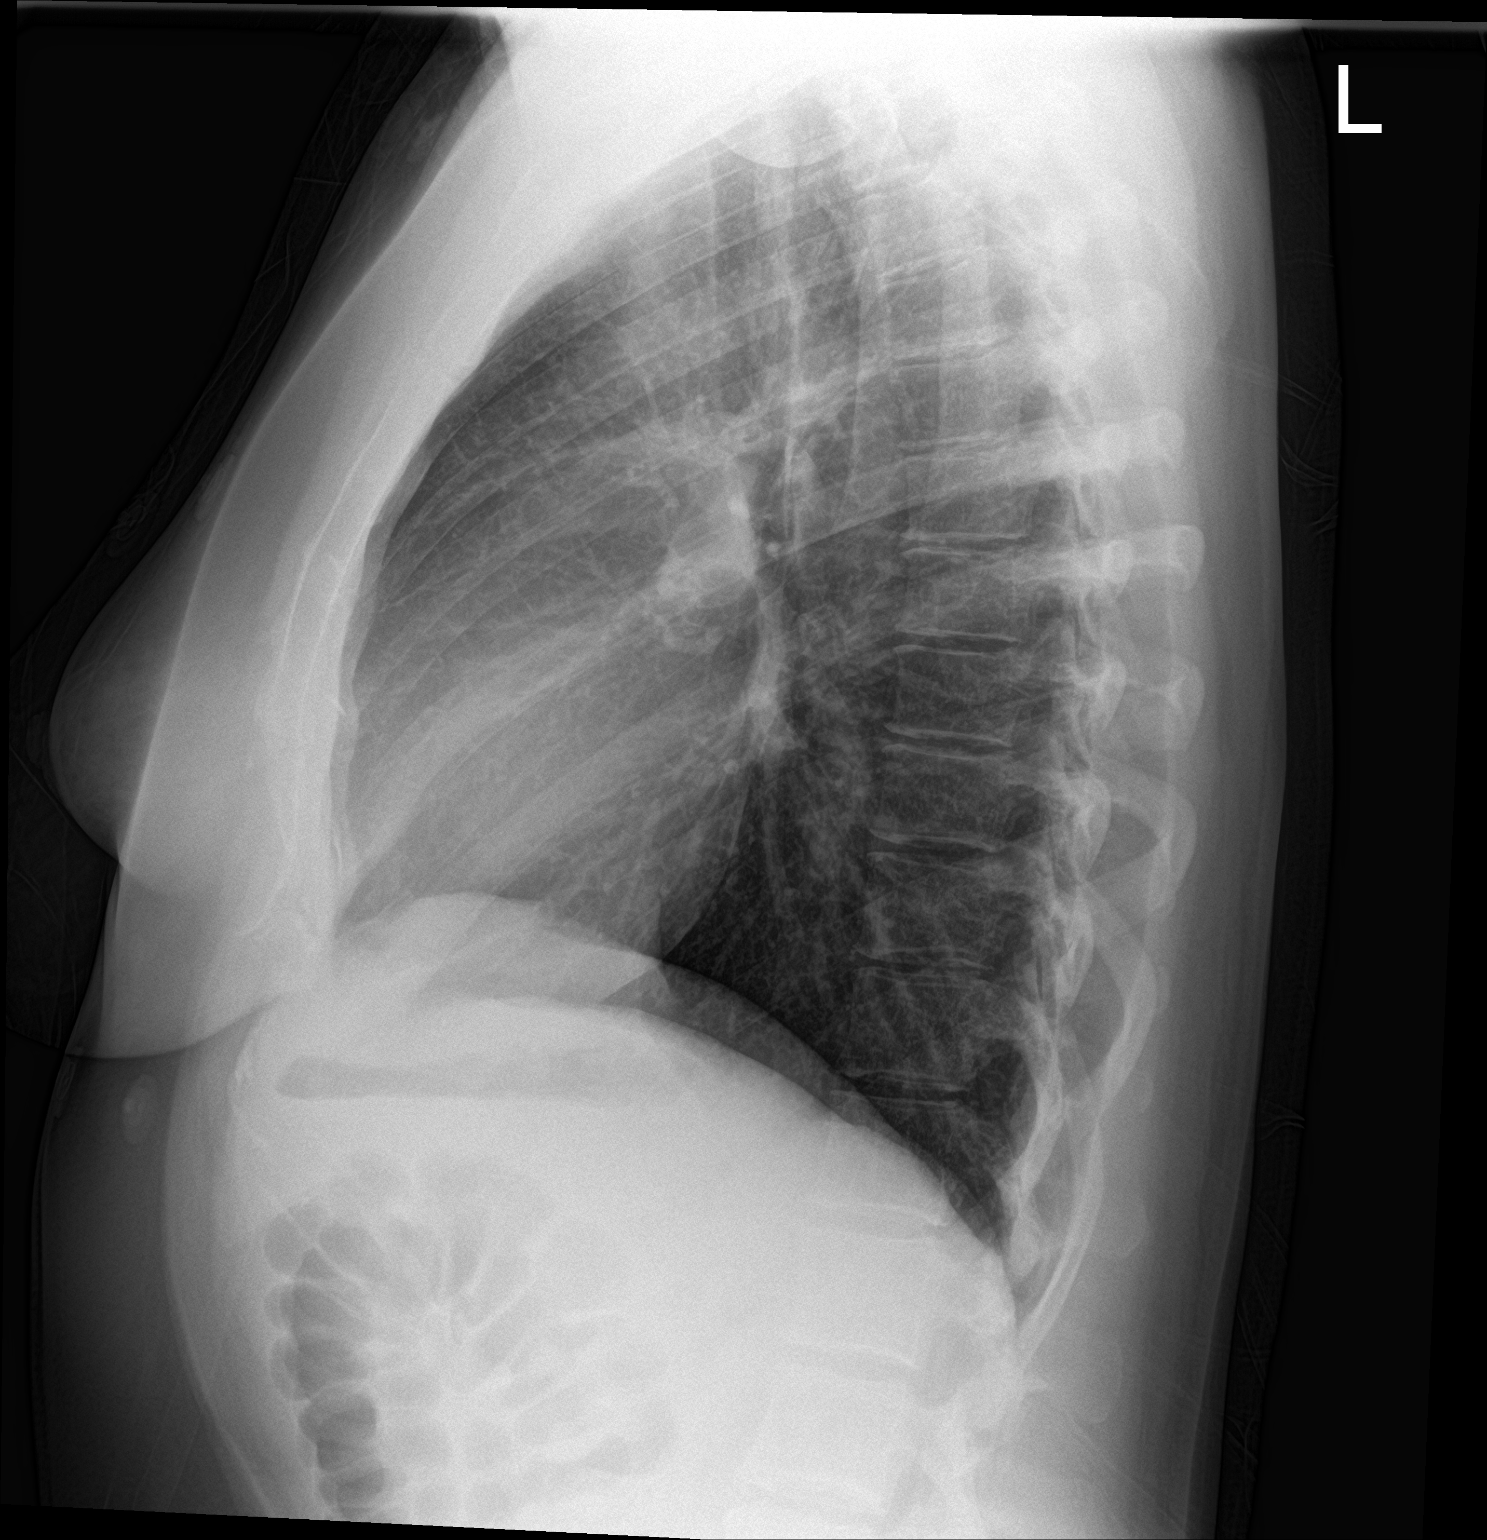

[chest pa]
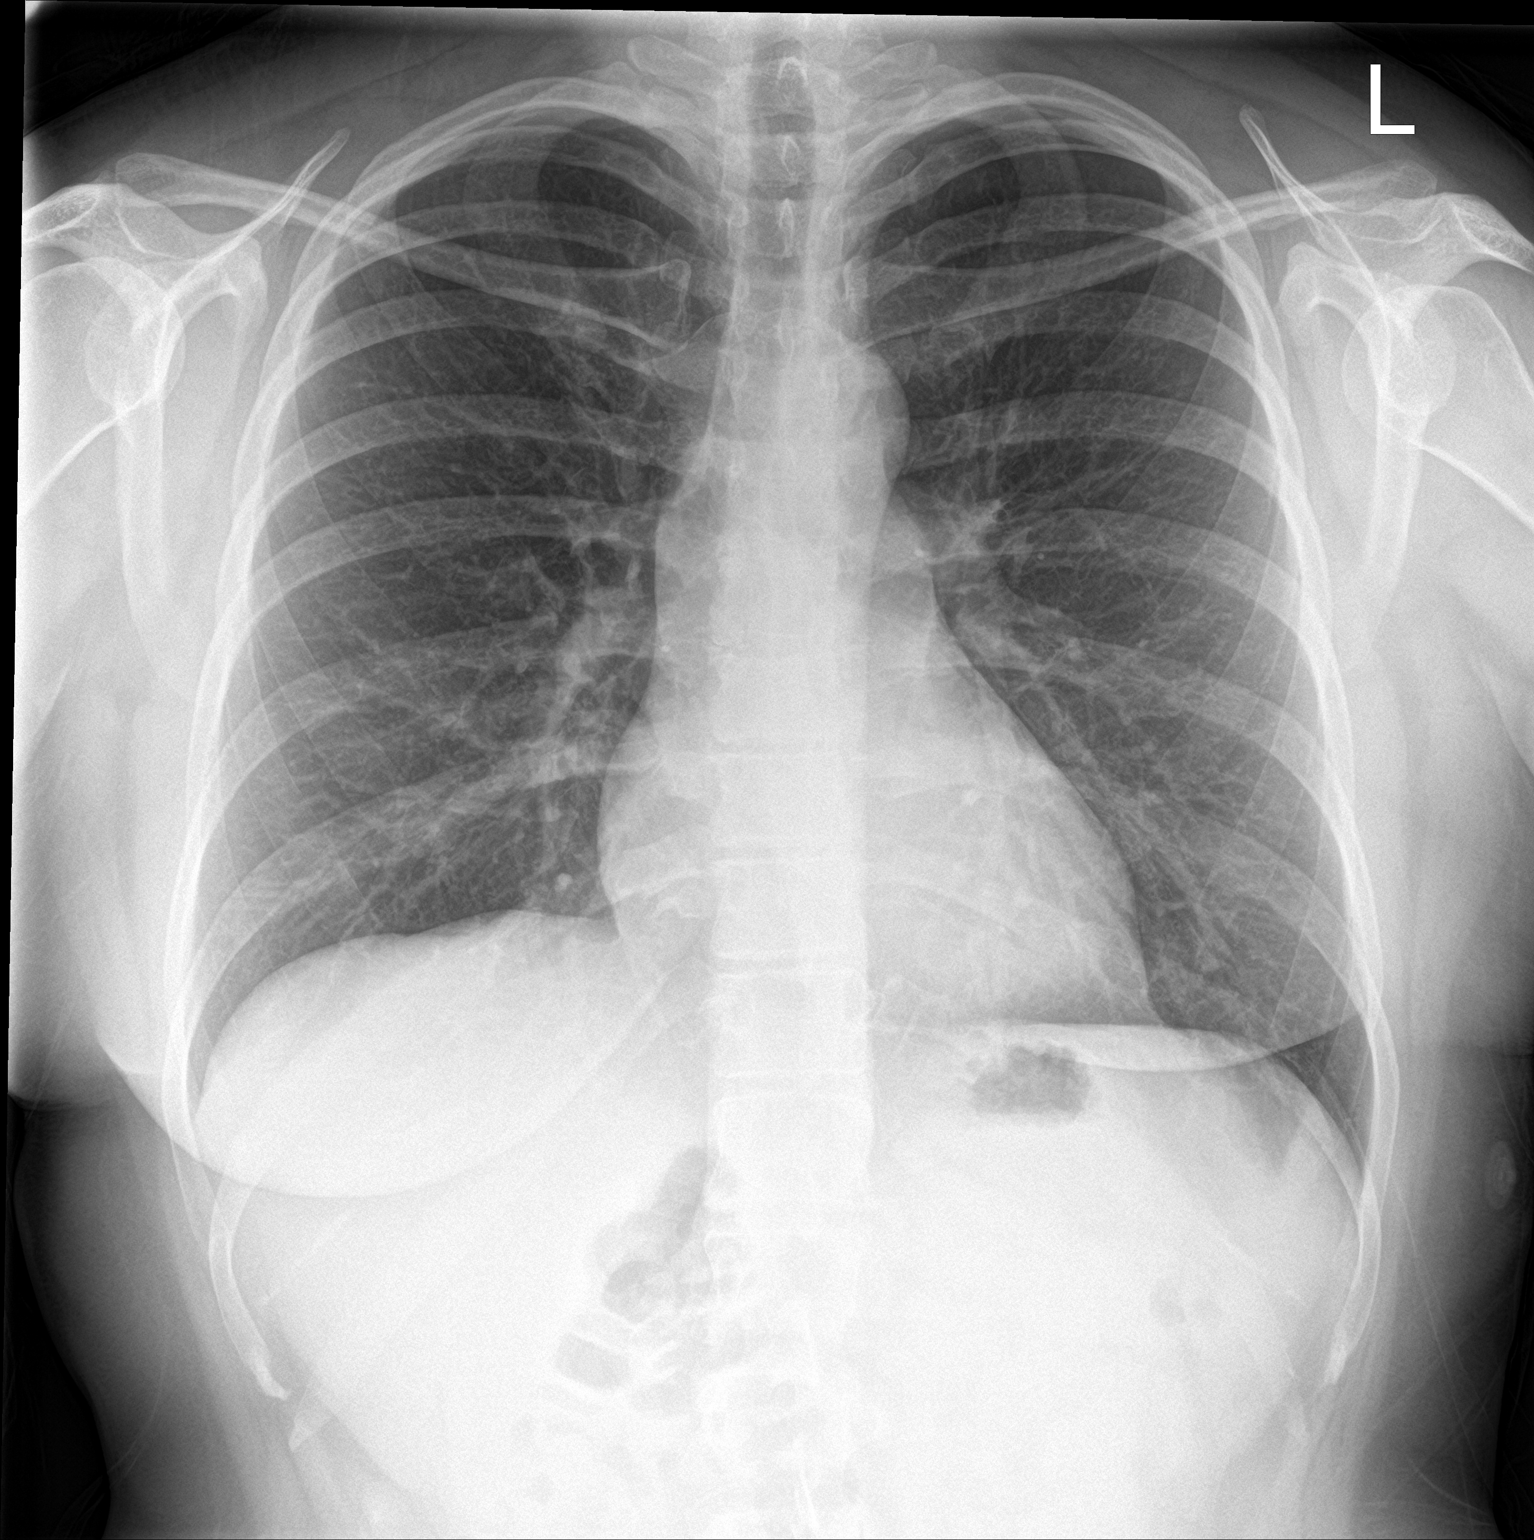

[2 of 2 positions shown; findings below may reference images not displayed]

FINDINGS: The heart size and mediastinal contours are within normal limits.
Both lungs are clear. The visualized skeletal structures are
unremarkable.
IMPRESSION: Normal study.

## 2022-12-31 ENCOUNTER — Emergency Department (HOSPITAL_COMMUNITY)
Admission: EM | Admit: 2022-12-31 | Discharge: 2022-12-31 | Disposition: A | Discharge status: Home / Self Care | Payer: 59 | Attending: Emergency Medicine | Admitting: Emergency Medicine

## 2022-12-31 ENCOUNTER — Other Ambulatory Visit: Payer: Self-pay

## 2022-12-31 ENCOUNTER — Ambulatory Visit
Admission: RE | Admit: 2022-12-31 | Discharge: 2022-12-31 | Disposition: A | Discharge status: Home / Self Care | Payer: 59 | Source: Ambulatory Visit | Attending: Nurse Practitioner | Admitting: Nurse Practitioner

## 2022-12-31 ENCOUNTER — Emergency Department (HOSPITAL_COMMUNITY): Payer: 59

## 2022-12-31 ENCOUNTER — Encounter (HOSPITAL_COMMUNITY): Payer: Self-pay

## 2022-12-31 VITALS — BP 143/86 | HR 65 | Temp 98.1°F | Resp 15

## 2022-12-31 DIAGNOSIS — Z3202 Encounter for pregnancy test, result negative: Secondary | ICD-10-CM

## 2022-12-31 DIAGNOSIS — R1031 Right lower quadrant pain: Secondary | ICD-10-CM

## 2022-12-31 DIAGNOSIS — N83202 Unspecified ovarian cyst, left side: Secondary | ICD-10-CM | POA: Diagnosis not present

## 2022-12-31 DIAGNOSIS — D72829 Elevated white blood cell count, unspecified: Secondary | ICD-10-CM | POA: Diagnosis not present

## 2022-12-31 DIAGNOSIS — J45909 Unspecified asthma, uncomplicated: Secondary | ICD-10-CM | POA: Diagnosis not present

## 2022-12-31 LAB — POCT URINALYSIS DIP (MANUAL ENTRY)
Bilirubin, UA: NEGATIVE
Blood, UA: NEGATIVE
Glucose, UA: NEGATIVE mg/dL
Ketones, POC UA: NEGATIVE mg/dL
Leukocytes, UA: NEGATIVE
Nitrite, UA: NEGATIVE
Spec Grav, UA: >=1.03 — AB (ref 1.010–1.025)
Urobilinogen, UA: 0.2 E.U./dL
pH, UA: 5.5 (ref 5.0–8.0)

## 2022-12-31 LAB — CBC
HCT: 41.7 % (ref 36.0–46.0)
Hemoglobin: 14 g/dL (ref 12.0–15.0)
MCH: 29.9 pg (ref 26.0–34.0)
MCHC: 33.6 g/dL (ref 30.0–36.0)
MCV: 89.1 fL (ref 80.0–100.0)
Platelets: 263 10*3/uL (ref 150–400)
RBC: 4.68 MIL/uL (ref 3.87–5.11)
RDW: 12.7 % (ref 11.5–15.5)
WBC: 11.3 10*3/uL — ABNORMAL HIGH (ref 4.0–10.5)
nRBC: 0 % (ref 0.0–0.2)

## 2022-12-31 LAB — COMPREHENSIVE METABOLIC PANEL
ALT: 16 U/L (ref 0–44)
AST: 18 U/L (ref 15–41)
Albumin: 4.1 g/dL (ref 3.5–5.0)
Alkaline Phosphatase: 54 U/L (ref 38–126)
Anion gap: 10 (ref 5–15)
BUN: 7 mg/dL (ref 6–20)
CO2: 20 mmol/L — ABNORMAL LOW (ref 22–32)
Calcium: 8.9 mg/dL (ref 8.9–10.3)
Chloride: 107 mmol/L (ref 98–111)
Creatinine, Ser: 0.81 mg/dL (ref 0.44–1.00)
GFR, Estimated: >60 mL/min (ref >60)
Glucose, Bld: 95 mg/dL (ref 70–99)
Potassium: 4.1 mmol/L (ref 3.5–5.1)
Sodium: 137 mmol/L (ref 135–145)
Total Bilirubin: 0.9 mg/dL (ref 0.3–1.2)
Total Protein: 7.7 g/dL (ref 6.5–8.1)

## 2022-12-31 LAB — I-STAT CHEM 8, ED
BUN: 7 mg/dL (ref 6–20)
Calcium, Ion: 1.17 mmol/L (ref 1.15–1.40)
Chloride: 107 mmol/L (ref 98–111)
Creatinine, Ser: 0.8 mg/dL (ref 0.44–1.00)
Glucose, Bld: 98 mg/dL (ref 70–99)
HCT: 42 % (ref 36.0–46.0)
Hemoglobin: 14.3 g/dL (ref 12.0–15.0)
Potassium: 4.1 mmol/L (ref 3.5–5.1)
Sodium: 139 mmol/L (ref 135–145)
TCO2: 21 mmol/L — ABNORMAL LOW (ref 22–32)

## 2022-12-31 LAB — POCT URINE PREGNANCY: Preg Test, Ur: NEGATIVE

## 2022-12-31 MED ORDER — POLYETHYLENE GLYCOL 3350 17 GM/SCOOP PO POWD: 1.0000 | Freq: Once | ORAL | 0 refills | Status: AC

## 2022-12-31 MED ORDER — IOHEXOL 350 MG/ML SOLN
75.0000 mL | Freq: Once | INTRAVENOUS | Status: AC | PRN
  Administered 2022-12-31: 75 mL via INTRAVENOUS

## 2022-12-31 MED ORDER — ONDANSETRON HCL 4 MG/2ML IJ SOLN
4.0000 mg | Freq: Once | INTRAMUSCULAR | Status: AC
  Administered 2022-12-31: 4 mg via INTRAVENOUS
  Filled 2022-12-31: qty 2

## 2022-12-31 MED ORDER — IBUPROFEN 600 MG PO TABS: 600.0000 mg | ORAL_TABLET | Freq: Four times a day (QID) | ORAL | 0 refills | Status: DC | PRN

## 2022-12-31 NOTE — Discharge Instructions (Addendum)
Please go directly to the ER for further evaluation and management of the abdominal pain you are having.

## 2022-12-31 NOTE — ED Provider Notes (Signed)
[place] EMERGENCY DEPARTMENT AT [facility] Provider Note   CSN: [id number] Arrival date & time: 12/31/22  1147     History  Chief Complaint  Patient presents with   r/o appendicitis    Tracey Avila is a 37 y.o. female.  HPI  37 year old female presents emergency department with complaints of abdominal pain.  Patient states that symptoms began around 9 PM on Friday night located in her right lower quadrant.  Denies radiation of pain.  Describes pain as sharp/stabbing.  Pain is worsened with coughing, sneezing, having a bowel movement, passing gas.  Reports some nausea with no emesis.  Denies any fever, chills, urinary symptoms, vaginal symptoms, change in bowel habits.  Last bowel movement this morning and regular per patient.  Does report prior abdominal surgery including right-sided oophorectomy secondary to large complicating ovarian cyst.  Past medical history significant for GERD, ovarian cyst, obesity, GAD, asthma, pericarditis  Home Medications Prior to Admission medications   Medication Sig Start Date End Date Taking? Authorizing Provider  ibuprofen (ADVIL) 600 MG tablet Take 1 tablet (600 mg total) by mouth every 6 (six) hours as needed. 12/31/22  Yes Sherian Maroon A, PA  polyethylene glycol powder (GLYCOLAX/MIRALAX) 17 GM/SCOOP powder Take 255 g by mouth once for 1 dose. 12/31/22 12/31/22 Yes Sherian Maroon A, PA  acetaminophen (TYLENOL) 500 MG tablet Take 1,000 mg by mouth as needed.    [provider]  albuterol (VENTOLIN HFA) 108 (90 Base) MCG/ACT inhaler Inhale 2 puffs into the lungs every 4 (four) hours as needed. 01/18/21   [provider]  EPINEPHrine 0.3 mg/0.3 mL IJ SOAJ injection Inject into the muscle as directed. 01/18/21   [provider]  famotidine (PEPCID) 20 MG tablet Take 1 tablet (20 mg total) by mouth daily. 07/06/22   Elenore Paddy, NP  LORazepam (ATIVAN) 0.5 MG tablet Take 1 mg by mouth daily as needed. 03/19/21    [provider]      Allergies    Bee venom and Wasp venom    Review of Systems   Review of Systems  All other systems reviewed and are negative.   Physical Exam Updated Vital Signs BP (!) 141/75 (BP Location: Right Arm)   Pulse 65   Temp 98.7 F (37.1 C) (Oral)   Resp 16   LMP 11/27/2022 (Within Weeks)   SpO2 100%  Physical Exam Vitals and nursing note reviewed.  Constitutional:      General: She is not in acute distress.    Appearance: She is well-developed.  HENT:     Head: Normocephalic and atraumatic.  Eyes:     Conjunctiva/sclera: Conjunctivae normal.  Cardiovascular:     Rate and Rhythm: Normal rate and regular rhythm.     Heart sounds: No murmur heard. Pulmonary:     Effort: Pulmonary effort is normal. No respiratory distress.     Breath sounds: Normal breath sounds.  Abdominal:     Palpations: Abdomen is soft.     Tenderness: There is abdominal tenderness in the right lower quadrant. There is no right CVA tenderness, left CVA tenderness or guarding. Positive signs include McBurney's sign. Negative signs include Murphy's sign.  Musculoskeletal:        General: No swelling.     Cervical back: Neck supple.  Skin:    General: Skin is warm and dry.     Capillary Refill: Capillary refill takes less than 2 seconds.  Neurological:     Mental Status:  She is alert.  Psychiatric:        Mood and Affect: Mood normal.     ED Results / Procedures / Treatments   Labs (all labs ordered are listed, but only abnormal results are displayed) Labs Reviewed  CBC - Abnormal; Notable for the following components:      Result Value   WBC 11.3 (*)    All other components within normal limits  COMPREHENSIVE METABOLIC PANEL - Abnormal; Notable for the following components:   CO2 20 (*)    All other components within normal limits  I-STAT CHEM 8, ED - Abnormal; Notable for the following components:   TCO2 21 (*)    All other components within normal limits     EKG None  Radiology CT ABDOMEN PELVIS W CONTRAST  Result Date: 12/31/2022 CLINICAL DATA:  Right lower quadrant abdominal pain. EXAM: CT ABDOMEN AND PELVIS WITH CONTRAST TECHNIQUE: Multidetector CT imaging of the abdomen and pelvis was performed using the standard protocol following bolus administration of intravenous contrast. RADIATION DOSE REDUCTION: This exam was performed according to the departmental dose-optimization program which includes automated exposure control, adjustment of the mA and/or kV according to patient size and/or use of iterative reconstruction technique. CONTRAST:  75mL OMNIPAQUE IOHEXOL 350 MG/ML SOLN COMPARISON:  Chest CT-10/20 09/2020 FINDINGS: Lower chest: Limited visualization of the lower thorax is negative for focal airspace opacity or pleural effusion. Normal heart size.  No pericardial effusion. Hepatobiliary: Normal hepatic contour. There is a minimal amount of focal fatty infiltration adjacent to the fissure for the ligamentum teres. No discrete hepatic lesions. Normal appearance of the gallbladder given degree distention. No radiopaque gallstones. No intra or extrahepatic biliary ductal dilatation. No ascites. Pancreas: Normal appearance of the pancreas. Spleen: Normal appearance of the spleen. Adrenals/Urinary Tract: There is symmetric enhancement of the bilateral kidneys. No evidence of nephrolithiasis on this postcontrast examination. No discrete renal lesions. No urinary obstruction or perinephric stranding. Normal appearance of the bilateral adrenal glands. Normal appearance of the urinary bladder given degree of distention. Stomach/Bowel: Scattered minimal colonic diverticulosis without evidence superimposed acute diverticulitis. Moderate colonic stool burden without evidence of enteric obstruction. Normal appearance of the terminal ileum and the retrocecal appendix (best seen on coronal images seventy-eight through 83, series 6). No discrete areas of bowel wall  thickening. No hiatal hernia. No pneumoperitoneum, pneumatosis or portal venous gas. Vascular/Lymphatic: Normal caliber of the abdominal aorta. The major branch vessels of the abdominal aorta appear patent on this non CTA examination. No bulky retroperitoneal, mesenteric, pelvic or inguinal lymphadenopathy. Reproductive: Note is made of an approximately 2.2 x 1.3 cm presumably physiologic left-sided adnexal cyst (image 68, series 3). No discrete right-sided adnexal lesions. No free fluid the pelvic cul-de-sac. Other: Minimal amount of subcutaneous edema about the midline of the low back. Musculoskeletal: No acute or aggressive osseous abnormalities. There is a punctate bone island within the right femoral head. IMPRESSION: 1. No explanation for patient's acute right lower quadrant abdominal pain. Specifically, no evidence of enteric or urinary obstruction. Normal appearance of the appendix. 2. Scattered colonic diverticulosis without evidence superimposed acute diverticulitis. 3. Moderate colonic stool burden without evidence of enteric obstruction. 4. Incidentally noted approximately 2.2 cm presumably physiologic left-sided adnexal cyst. Electronically Signed   By: Simonne Come M.D.   On: 12/31/2022 14:02    Procedures Procedures    Medications Ordered in ED Medications  ondansetron (ZOFRAN) injection 4 mg (4 mg Intravenous Given 12/31/22 1341)  iohexol (OMNIPAQUE) 350 MG/ML injection  75 mL (75 mLs Intravenous Contrast Given 12/31/22 1321)    ED Course/ Medical Decision Making/ A&P                             Medical Decision Making Risk Prescription drug management.   This patient presents to the ED for concern of abdominal pain, this involves an extensive number of treatment options, and is a complaint that carries with it a high risk of complications and morbidity.  The differential diagnosis includes appendicitis, diverticulitis, ovarian torsion, ectopic pregnancy, pyelonephritis,  nephrolithiasis, cystitis, SBO/LBO   Co morbidities that complicate the patient evaluation  See HPI   Additional history obtained:  Additional history obtained from EMR External records from outside source obtained and reviewed including hospital records   Lab Tests:  I Ordered, and personally interpreted labs.  The pertinent results include: Patient in urine pregnancy test at urgent care which was negative along with UA that showed trace proteins and greater than 1.03 specific gravity but otherwise unremarkable. Mild leukocytosis of 11.3. No anemia. No platelet abnormalities. No electrolyte abnormalities, no transaminitis, no renal dysfunction.    Imaging Studies ordered:  I ordered imaging studies including CT abdomen pelvis I independently visualized and interpreted imaging which showed no evidence for right lower quadrant abdominal abnormality.  Appendix normal.  Scattered colonic diverticulosis without evidence of diverticulitis.  Moderate colonic stool burden.  2.2 cm left adnexal cyst I agree with the radiologist interpretation  Cardiac Monitoring: / EKG:  The patient was maintained on a cardiac monitor.  I personally viewed and interpreted the cardiac monitored which showed an underlying rhythm of: Sinus rhythm   Consultations Obtained:  N/a   Problem List / ED Course / Critical interventions / Medication management  Right lower quadrant abdominal pain I ordered medication including Zofran   Reevaluation of the patient after these medicines showed that the patient improved I have reviewed the patients home medicines and have made adjustments as needed   Social Determinants of Health:  Denies tobacco, illicit drug use   Test / Admission - Considered:  Right lower quadrant abdominal pain Vitals signs significant for mild hypertension blood pressure 141/75. Otherwise within normal range and stable throughout visit. Laboratory/imaging studies significant for:  See above 37 year old female presents emergency department with complaints of right lower quad abdominal pain since Friday.  On exam, patient with right lower quadrant abdominal pain with McBurney point positive.  CT imaging was subsequently obtained which was negative for any acute appendicitis or visualization of any abnormal finding in the right lower quadrant.  Patient's symptoms could be secondary to moderate colonic stool burden especially since patient's symptoms seem to be exacerbated/relieved by passing of flatulence/bowel movements.  Will recommend MiraLAX titrated to effect in the outpatient setting as well as daily fiber supplementation.  Patient recommended close follow-up in the outpatient setting with primary care.  Treatment plan discussed at length with patient and she acknowledged understanding was agreeable to said plan.  Patient overall well-appearing, febrile no acute distress. Worrisome signs and symptoms were discussed with the patient, and the patient acknowledged understanding to return to the ED if noticed. Patient was stable upon discharge.          Final Clinical Impression(s) / ED Diagnoses Final diagnoses:  Right lower quadrant abdominal pain  Left ovarian cyst    Rx / DC Orders ED Discharge Orders          Ordered  polyethylene glycol powder (GLYCOLAX/MIRALAX) 17 GM/SCOOP powder   Once        12/31/22 1408    ibuprofen (ADVIL) 600 MG tablet  Every 6 hours PRN        12/31/22 1408              Peter Garter, [place] 12/31/22 1457    Gwyneth Sprout, MD 01/03/23 2340

## 2022-12-31 NOTE — ED Provider Notes (Signed)
[facility]    CSN: [id number] Arrival date & time: 12/31/22  0841      History   Chief Complaint Chief Complaint  Patient presents with   Abdominal Pain    Pain in lower right abdomen that is sharp and sudden, doesn't linger but happens multiple times through the day. Always in the same spot and happens the most  when coughing, laughing, or using bathroom. - Entered by patient    HPI Tracey Avila is a 37 y.o. female.   Patient presents today with 2-day history of right lower abdominal pain that is sharp and feels like a stabbing.  She reports the pain is severe and lasts only for a couple of seconds.  Pain is worse when laughing, coughing, or trying to have a bowel movement.  Pain onset is rapid, also goes away quickly.  Patient denies radiation of pain around to her back or any other part of her abdomen.  She reports occasionally, the pain radiates down the right lower extremity to the knee. Patient endorses chills last night, however no fever, nausea, decreased appetite, constipation.  Reports pain is worse with having a bowel movement and she endorses history of constipation, does not take anything for constipation. Patient denies known fevers, vomiting, diarrhea, blood in the stool, heartburn, new rash, dysuria, urinary frequency, or hematuria.  No change in color or urine odor.    History of oophorectomy of the right side.  No other abdominal surgeries.    Past Medical History:  Diagnosis Date   Anxiety    Asthma    Burning chest pain    Chest pain    Dermoid cyst    Right ovary   GERD (gastroesophageal reflux disease)    Left arm numbness    Mid back pain    Pericarditis    Syncope     Patient Active Problem List   Diagnosis Date Noted   Gastroesophageal reflux disease 07/06/2022   Chest pain 01/08/2022   Encounter for general adult medical examination with abnormal findings 01/04/2022   Class 1 obesity without serious comorbidity with body mass  index (BMI) of 31.0 to 31.9 in adult 01/04/2022   Rash 01/04/2022   Acne 01/04/2022   Syncope 09/07/2021   Anxiety 06/23/2021   GAD (generalized anxiety disorder) 06/23/2021   Asthma 05/30/2021   Abnormal auditory perception of left ear 02/10/2016   Tinnitus, left ear 02/10/2016    Past Surgical History:  Procedure Laterality Date   LAPAROSCOPY N/A 09/12/2021   Procedure: LAPAROSCOPY DIAGNOSTIC;  Surgeon: Harold Hedge, MD;  Location: [facility];  Service: Gynecology;  Laterality: N/A;   OOPHORECTOMY Right 09/12/2021   Procedure: OOPHORECTOMY;  Surgeon: Harold Hedge, MD;  Location: [facility];  Service: Gynecology;  Laterality: Right;   TONSILLECTOMY     WISDOM TOOTH EXTRACTION      OB History   No obstetric history on file.      Home Medications    Prior to Admission medications   Medication Sig Start Date End Date Taking? Authorizing Provider  acetaminophen (TYLENOL) 500 MG tablet Take 1,000 mg by mouth as needed.    [provider]  albuterol (VENTOLIN HFA) 108 (90 Base) MCG/ACT inhaler Inhale 2 puffs into the lungs every 4 (four) hours as needed. 01/18/21   [provider]  EPINEPHrine 0.3 mg/0.3 mL IJ SOAJ injection Inject into the muscle as directed. 01/18/21   [provider]  famotidine (PEPCID) 20 MG tablet  Take 1 tablet (20 mg total) by mouth daily. 07/06/22   Elenore Paddy, NP  LORazepam (ATIVAN) 0.5 MG tablet Take 1 mg by mouth daily as needed. 03/19/21   [provider]    Family History Family History  Problem Relation Age of Onset   Hypertension Father    Hyperlipidemia Father     Social History Social History   Tobacco Use   Smoking status: Never   Smokeless tobacco: Never  Vaping Use   Vaping status: Never Used  Substance Use Topics   Alcohol use: Yes    Comment: socially   Drug use: No     Allergies   Bee venom and Wasp venom   Review of Systems Review of Systems Per  HPI  Physical Exam Triage Vital Signs ED Triage Vitals  Encounter Vitals Group     BP 12/31/22 0956 (!) 143/86     Systolic BP Percentile --      Diastolic BP Percentile --      Pulse Rate 12/31/22 0956 65     Resp 12/31/22 0956 15     Temp 12/31/22 0956 98.1 F (36.7 C)     Temp Source 12/31/22 0956 Oral     SpO2 12/31/22 0956 98 %     Weight --      Height --      Head Circumference --      Peak Flow --      Pain Score 12/31/22 1001 7     Pain Loc --      Pain Education --      Exclude from Growth Chart --    No data found.  Updated Vital Signs BP (!) 143/86 (BP Location: Right Arm)   Pulse 65   Temp 98.1 F (36.7 C) (Oral)   Resp 15   LMP 11/27/2022 (Within Weeks)   SpO2 98%   Visual Acuity Right Eye Distance:   Left Eye Distance:   Bilateral Distance:    Right Eye Near:   Left Eye Near:    Bilateral Near:     Physical Exam Vitals and nursing note reviewed.  Constitutional:      General: She is not in acute distress.    Appearance: Normal appearance. She is not toxic-appearing.  HENT:     Head: Normocephalic and atraumatic.     Mouth/Throat:     Mouth: Mucous membranes are moist.     Pharynx: Oropharynx is clear.  Eyes:     General: No scleral icterus.    Extraocular Movements: Extraocular movements intact.  Cardiovascular:     Rate and Rhythm: Regular rhythm. Tachycardia present.  Pulmonary:     Effort: Pulmonary effort is normal. No respiratory distress.     Breath sounds: Normal breath sounds. No wheezing, rhonchi or rales.  Abdominal:     General: Abdomen is flat. Bowel sounds are normal. There is no distension.     Palpations: Abdomen is soft.     Tenderness: There is abdominal tenderness. There is guarding. Positive signs include Rovsing's sign and McBurney's sign. Negative signs include Murphy's sign.  Musculoskeletal:     Cervical back: Normal range of motion.  Lymphadenopathy:     Cervical: No cervical adenopathy.  Skin:    General:  Skin is warm and dry.     Capillary Refill: Capillary refill takes less than 2 seconds.     Coloration: Skin is not jaundiced or pale.     Findings: No erythema.  Neurological:  Mental Status: She is alert and oriented to person, place, and time.  Psychiatric:        Behavior: Behavior is cooperative.      UC Treatments / Results  Labs (all labs ordered are listed, but only abnormal results are displayed) Labs Reviewed  POCT URINALYSIS DIP (MANUAL ENTRY) - Abnormal; Notable for the following components:      Result Value   Spec Grav, UA >=1.030 (*)    Protein Ur, POC trace (*)    All other components within normal limits  POCT URINE PREGNANCY    EKG   Radiology No results found.  Procedures Procedures (including critical care time)  Medications Ordered in UC Medications - No data to display  Initial Impression / Assessment and Plan / UC Course  I have reviewed the triage vital signs and the nursing notes.  Pertinent labs & imaging results that were available during my care of the patient were reviewed by me and considered in my medical decision making (see chart for details).   Patient is well-appearing, normotensive, afebrile, not tachycardic, not tachypneic, oxygenating well on room air.    1. RLQ abdominal pain Patient is positive for Rovsings sign, also positive at McBurney's point Cannot rule out appendicitis in urgent care, discussed with patient Recommended further evaluation and management emergency room Patient is in agreement with plan Patient is safe to transport via private vehicle at this time  2. Urine pregnancy test negative    The patient was given the opportunity to ask questions.  All questions answered to their satisfaction.  The patient is in agreement to this plan.    Final Clinical Impressions(s) / UC Diagnoses   Final diagnoses:  RLQ abdominal pain  Urine pregnancy test negative     Discharge Instructions      Please go  directly to the ER for further evaluation and management of the abdominal pain you are having.     ED Prescriptions   None    PDMP not reviewed this encounter.   Valentino Nose, NP 12/31/22 1037

## 2022-12-31 NOTE — ED Notes (Addendum)
Patient is being discharged from the Urgent Care and sent to the Emergency Department via private vehicle . Per Phil Dopp, NP, patient is in need of higher level of care due to abd pain R/O appendicitis. Patient is aware and verbalizes understanding of plan of care.  Vitals:   12/31/22 0956  BP: (!) 143/86  Pulse: 65  Resp: 15  Temp: 98.1 F (36.7 C)  SpO2: 98%

## 2022-12-31 NOTE — Discharge Instructions (Addendum)
As discussed, CT imaging was negative for appendicitis.  Your appendix looks normal size and without signs of infection/inflammation.  You did have evidence of pretty significant stool burden in your colon which could be making her symptoms worse along with a left-sided ovarian cyst 2.2 cm in size which is not causing problems at the moment.  Will recommend treatment of pain with NSAIDs at home as well as use of MiraLAX to help regulate his bowel movements; I recommend starting with 2-4 capfuls.  He may increase/decrease MiraLAX to help aid in regular soft bowel movements.  If it causes diarrhea, decrease dosage and subsequently increase if you find that it is not inducing bowel movements.  Also recommend daily fiber supplementation that he can find over-the-counter at your local pharmacy.  Recommend follow-up with your primary care for reassessment of your symptoms.  Please do not hesitate to return to emergency department at the worrisome signs and symptoms were discussed to become apparent.

## 2022-12-31 NOTE — ED Triage Notes (Addendum)
Pt c/o intermittent stabbing pain RLQ pain since 9 pm Friday; worse with cough with BM, flatulence; no urinary issues; denies fevers; endorses some nausea, lack of appetite; denies pain currently; seen at urgent care earlier today, sent for further evaluation and appendicitis R/O

## 2022-12-31 NOTE — ED Triage Notes (Addendum)
Pt c/o Pain in intermittent RLQ of abdomen that is sharp stabbing and sudden, but happens multiple times through the day. Always in the same spot and happens the most  when coughing, laughing, or using bathroom. Pressure with radiation down into the legs. nausea also occurs  Onset Saturday evening at 9:00 pm.   Pt states she does not have an ovary, on the side where the pain occurs.

## 2023-01-01 ENCOUNTER — Telehealth: Payer: Self-pay

## 2023-01-01 NOTE — Transitions of Care (Post Inpatient/ED Visit) (Unsigned)
   01/01/2023  Name: Tracey Avila MRN: [id number] DOB: May 04, 1986  Today's TOC FU Call Status: Today's TOC FU Call Status:: Unsuccessul Call (1st Attempt) Unsuccessful Call (1st Attempt) Date: 01/01/23  Attempted to reach the patient regarding the most recent Inpatient/ED visit.  Follow Up Plan: Additional outreach attempts will be made to reach the patient to complete the Transitions of Care (Post Inpatient/ED visit) call.   Signature Karena Addison, LPN [facility] Nurse Health Advisor Direct Dial [phone]

## 2023-01-02 NOTE — Transitions of Care (Post Inpatient/ED Visit) (Unsigned)
   01/02/2023  Name: Tracey Avila MRN: [id number] DOB: 02-09-1986  Today's TOC FU Call Status: Today's TOC FU Call Status:: Unsuccessful Call (2nd Attempt) Unsuccessful Call (1st Attempt) Date: 01/01/23 Unsuccessful Call (2nd Attempt) Date: 01/02/23  Attempted to reach the patient regarding the most recent Inpatient/ED visit.  Follow Up Plan: Additional outreach attempts will be made to reach the patient to complete the Transitions of Care (Post Inpatient/ED visit) call.   Signature Karena Addison, LPN [facility] Nurse Health Advisor Direct Dial [phone]

## 2023-01-03 NOTE — Transitions of Care (Post Inpatient/ED Visit) (Signed)
   01/03/2023  Name: Tracey Avila MRN: [id number] DOB: [birth year]  Today's TOC FU Call Status: Today's TOC FU Call Status:: Unsuccessful Call (3rd Attempt) Unsuccessful Call (1st Attempt) Date: 01/01/23 Unsuccessful Call (2nd Attempt) Date: 01/02/23 Unsuccessful Call (3rd Attempt) Date: 01/03/23  Attempted to reach the patient regarding the most recent Inpatient/ED visit.  Follow Up Plan: No further outreach attempts will be made at this time. We have been unable to contact the patient.  Signature Karena Addison, LPN [facility] Nurse Health Advisor Direct Dial [phone]

## 2023-01-18 ENCOUNTER — Ambulatory Visit: Payer: 59 | Admitting: Nurse Practitioner

## 2023-01-18 VITALS — BP 98/76 | HR 71 | Temp 98.1°F | Ht 66.0 in | Wt 195.2 lb

## 2023-01-18 DIAGNOSIS — J029 Acute pharyngitis, unspecified: Secondary | ICD-10-CM | POA: Insufficient documentation

## 2023-01-18 LAB — POCT RESPIRATORY SYNCYTIAL VIRUS: RSV Rapid Ag: NEGATIVE

## 2023-01-18 LAB — POCT INFLUENZA A/B
Influenza A, POC: NEGATIVE
Influenza B, POC: NEGATIVE

## 2023-01-18 LAB — POCT RAPID STREP A (OFFICE): Rapid Strep A Screen: NEGATIVE

## 2023-01-18 LAB — POC COVID19 BINAXNOW: SARS Coronavirus 2 Ag: NEGATIVE

## 2023-01-18 MED ORDER — AMOXICILLIN 500 MG PO CAPS: 500.0000 mg | ORAL_CAPSULE | Freq: Two times a day (BID) | ORAL | 0 refills | Status: DC

## 2023-01-18 NOTE — Progress Notes (Signed)
Established Patient Office Visit  Subjective   Patient ID: Tracey Avila, female    DOB: September 19, 1985  Age: 37 y.o. MRN: [id number]  Chief Complaint  Patient presents with   Sore Throat    Pharyngitis: Symptom onset 4 days ago.  Has been exposed to strep throat at her workplace.  Has been using cough drops over-the-counter, otherwise not taking any treatment for symptoms.    Review of Systems  Constitutional:  Negative for chills and fever.  HENT:  Positive for congestion and sore throat.   Respiratory:  Positive for cough. Negative for shortness of breath and wheezing.   Cardiovascular:  Negative for chest pain.  Gastrointestinal:  Negative for heartburn.  Neurological:  Negative for headaches.      Objective:     BP 98/76   Pulse 71   Temp 98.1 F (36.7 C) (Temporal)   Ht 5\' 6"  (1.676 m)   Wt 195 lb 4 oz (88.6 kg)   LMP 11/27/2022 (Within Weeks)   SpO2 97%   BMI 31.51 kg/m  BP Readings from Last 3 Encounters:  01/18/23 98/76  12/31/22 (!) 141/75  12/31/22 (!) 143/86   Wt Readings from Last 3 Encounters:  01/18/23 195 lb 4 oz (88.6 kg)  07/06/22 195 lb (88.5 kg)  02/16/22 188 lb 12.8 oz (85.6 kg)      Physical Exam Vitals reviewed.  Constitutional:      General: She is not in acute distress.    Appearance: Normal appearance.  HENT:     Head: Normocephalic and atraumatic.     Right Ear: Hearing, tympanic membrane, ear canal and external ear normal.     Left Ear: Hearing, tympanic membrane, ear canal and external ear normal.     Mouth/Throat:     Mouth: Mucous membranes are moist.     Palate: Lesions present.     Pharynx: Uvula midline. Posterior oropharyngeal erythema present.  Neck:     Vascular: No carotid bruit.  Cardiovascular:     Rate and Rhythm: Normal rate and regular rhythm.     Pulses: Normal pulses.     Heart sounds: Normal heart sounds.  Pulmonary:     Effort: Pulmonary effort is normal.     Breath sounds: Normal breath sounds.   Lymphadenopathy:     Cervical: No cervical adenopathy.  Skin:    General: Skin is warm and dry.  Neurological:     General: No focal deficit present.     Mental Status: She is alert and oriented to person, place, and time.  Psychiatric:        Mood and Affect: Mood normal.        Behavior: Behavior normal.        Judgment: Judgment normal.    Estimated Creatinine Clearance: 110 mL/min (by C-G formula based on SCr of 0.8 mg/dL).   Results for orders placed or performed in visit on 01/18/23  POCT rapid strep A  Result Value Ref Range   Rapid Strep A Screen Negative Negative  POC COVID-19 BinaxNow  Result Value Ref Range   SARS Coronavirus 2 Ag Negative Negative  POCT Influenza A/B  Result Value Ref Range   Influenza A, POC Negative Negative   Influenza B, POC Negative Negative  POCT respiratory syncytial virus  Result Value Ref Range   RSV Rapid Ag negative       The ASCVD Risk score (Arnett DK, et al., 2019) failed to calculate for the following reasons:  The 2019 ASCVD risk score is only valid for ages 87 to 51    Assessment & Plan:   Problem List Items Addressed This Visit       Respiratory   Pharyngitis - Primary    POC strep, covid, RSV, flu negative. Her exam does appear consistent with strep throat. Will treat with amoxicillin 500mg  BID x 10 days. Patient told to call office if symptoms worsen, do not improve, or if she develops a rash.  F/u 3-6 months for CPE      Relevant Medications   amoxicillin (AMOXIL) 500 MG capsule   Other Relevant Orders   POCT rapid strep A (Completed)   POC COVID-19 BinaxNow (Completed)   POCT Influenza A/B (Completed)   POCT respiratory syncytial virus (Completed)    Return in about 3 months (around 04/20/2023) for 3-6 momhs for CPE with Maralyn Sago.    Elenore Paddy, NP

## 2023-01-18 NOTE — Assessment & Plan Note (Signed)
POC strep, covid, RSV, flu negative. Her exam does appear consistent with strep throat. Will treat with amoxicillin 500mg  BID x 10 days. Patient told to call office if symptoms worsen, do not improve, or if she develops a rash.  F/u 3-6 months for CPE

## 2023-02-15 ENCOUNTER — Ambulatory Visit: Payer: Self-pay | Admitting: Cardiology

## 2023-03-12 ENCOUNTER — Ambulatory Visit: Payer: 59 | Admitting: Cardiology

## 2023-04-25 ENCOUNTER — Encounter: Payer: 59 | Admitting: Nurse Practitioner

## 2023-06-06 ENCOUNTER — Ambulatory Visit (INDEPENDENT_AMBULATORY_CARE_PROVIDER_SITE_OTHER): Payer: 59 | Admitting: Nurse Practitioner

## 2023-06-06 VITALS — BP 100/62 | HR 69 | Temp 98.5°F | Ht 66.0 in | Wt 207.0 lb

## 2023-06-06 DIAGNOSIS — K219 Gastro-esophageal reflux disease without esophagitis: Secondary | ICD-10-CM | POA: Diagnosis not present

## 2023-06-06 DIAGNOSIS — Z0000 Encounter for general adult medical examination without abnormal findings: Secondary | ICD-10-CM

## 2023-06-06 DIAGNOSIS — R42 Dizziness and giddiness: Secondary | ICD-10-CM | POA: Diagnosis not present

## 2023-06-06 DIAGNOSIS — E66811 Obesity, class 1: Secondary | ICD-10-CM | POA: Diagnosis not present

## 2023-06-06 DIAGNOSIS — Z6833 Body mass index (BMI) 33.0-33.9, adult: Secondary | ICD-10-CM

## 2023-06-06 DIAGNOSIS — E669 Obesity, unspecified: Secondary | ICD-10-CM | POA: Insufficient documentation

## 2023-06-06 DIAGNOSIS — Z0001 Encounter for general adult medical examination with abnormal findings: Secondary | ICD-10-CM

## 2023-06-06 NOTE — Assessment & Plan Note (Signed)
Patient mentioned this me at end of exam. Notices this mostly around using the restroom and when she is on menstrual cycle. Etiology unclear. No syncope or near syncope, no chest pain, no palpitations. Check labs, neuro exam normal today. Further recommendations may be made based on lab results. Patient to reach out if symptoms become more bothersome or frequent.

## 2023-06-06 NOTE — Progress Notes (Signed)
Complete physical exam  Patient: Tracey Avila   DOB: 1985/08/23   37 y.o. Female  MRN: [id number]  Subjective:    Chief Complaint  Patient presents with   Annual Exam    Tracey Avila is a 37 y.o. female who presents today for a complete physical exam. She reports consuming a general diet.  Exercise: not exercising right now, but goal is walk 1 hour/day.  She generally feels well. She reports sleeping well. She does not have additional problems to discuss today.   Of note has history of GERD. Today reports she takes Tums as needed 2-4 times per month. Otherwise no bothersome symptoms.   Most recent fall risk assessment:    06/06/2023    4:06 PM  Fall Risk   Falls in the past year? 0  Number falls in past yr: 0  Injury with Fall? 0  Risk for fall due to : No Fall Risks  Follow up Falls evaluation completed     Most recent depression screenings:    06/06/2023    4:06 PM 01/18/2023    3:04 PM  PHQ 2/9 Scores  PHQ - 2 Score 0 0    Vision:Not within last year  and Dental: No current dental problems and Receives regular dental care  Patient Active Problem List   Diagnosis Date Noted   Obesity 06/06/2023   Dizziness 06/06/2023   Pharyngitis 01/18/2023   Gastroesophageal reflux disease 07/06/2022   Chest pain 01/08/2022   Encounter for general adult medical examination with abnormal findings 01/04/2022   Rash 01/04/2022   Acne 01/04/2022   Syncope 09/07/2021   Anxiety 06/23/2021   GAD (generalized anxiety disorder) 06/23/2021   Asthma 05/30/2021   Abnormal auditory perception of left ear 02/10/2016   Tinnitus, left ear 02/10/2016   Past Medical History:  Diagnosis Date   Anxiety    Asthma    Burning chest pain    Chest pain    Dermoid cyst    Right ovary   GERD (gastroesophageal reflux disease)    Left arm numbness    Mid back pain    Pericarditis    Syncope    Past Surgical History:  Procedure Laterality Date   LAPAROSCOPY N/A 09/12/2021   Procedure:  LAPAROSCOPY DIAGNOSTIC;  Surgeon: Harold Hedge, MD;  Location: [facility];  Service: Gynecology;  Laterality: N/A;   OOPHORECTOMY Right 09/12/2021   Procedure: OOPHORECTOMY;  Surgeon: Harold Hedge, MD;  Location: [facility];  Service: Gynecology;  Laterality: Right;   TONSILLECTOMY     WISDOM TOOTH EXTRACTION     Social History   Socioeconomic History   Marital status: Single    Spouse name: Not on file   Number of children: 0   Years of education: Not on file   Highest education level: Not on file  Occupational History   Not on file  Tobacco Use   Smoking status: Never   Smokeless tobacco: Never  Vaping Use   Vaping status: Never Used  Substance and Sexual Activity   Alcohol use: Yes    Comment: socially   Drug use: No   Sexual activity: Yes  Other Topics Concern   Not on file  Social History Narrative   Not on file   Social Drivers of Health   Financial Resource Strain: Not on file  Food Insecurity: Not on file  Transportation Needs: Not on file  Physical Activity: Not on file  Stress: Not on file  Social  Connections: Not on file  Intimate Partner Violence: Not on file   Family History  Problem Relation Age of Onset   Hypertension Father    Hyperlipidemia Father    Allergies  Allergen Reactions   Bee Venom Anaphylaxis   Wasp Venom Anaphylaxis      Patient Care Team: Elenore Paddy, NP as PCP - General (Nurse Practitioner)   Outpatient Medications Prior to Visit  Medication Sig   acetaminophen (TYLENOL) 500 MG tablet Take 1,000 mg by mouth as needed.   albuterol (VENTOLIN HFA) 108 (90 Base) MCG/ACT inhaler Inhale 2 puffs into the lungs every 4 (four) hours as needed.   EPINEPHrine 0.3 mg/0.3 mL IJ SOAJ injection Inject into the muscle as directed.   LORazepam (ATIVAN) 0.5 MG tablet Take 1 mg by mouth daily as needed.   [DISCONTINUED] amoxicillin (AMOXIL) 500 MG capsule Take 1 capsule (500 mg total) by mouth 2 (two) times  daily.   No facility-administered medications prior to visit.    Review of Systems  Constitutional:  Negative for chills, fever and weight loss.  HENT:  Positive for tinnitus (chronic). Negative for hearing loss.   Eyes:  Negative for blurred vision and double vision.  Respiratory:  Negative for cough, shortness of breath and wheezing.   Cardiovascular:  Negative for chest pain and palpitations.  Gastrointestinal:  Negative for abdominal pain, blood in stool, nausea and vomiting.  Genitourinary:  Negative for dysuria and hematuria.  Skin:  Negative for itching and rash.  Neurological:  Negative for seizures and loss of consciousness.  Psychiatric/Behavioral:  Negative for depression and suicidal ideas. The patient is not nervous/anxious.           Objective:     BP 100/62   Pulse 69   Temp 98.5 F (36.9 C) (Temporal)   Ht 5\' 6"  (1.676 m)   Wt 207 lb (93.9 kg)   SpO2 97%   BMI 33.41 kg/m  BP Readings from Last 3 Encounters:  06/06/23 100/62  01/18/23 98/76  12/31/22 (!) 141/75   Wt Readings from Last 3 Encounters:  06/06/23 207 lb (93.9 kg)  01/18/23 195 lb 4 oz (88.6 kg)  07/06/22 195 lb (88.5 kg)        06/06/2023    4:06 PM 01/18/2023    3:04 PM 07/06/2022    1:24 PM  PHQ9 SCORE ONLY  PHQ-9 Total Score 0 0 10     Physical Exam Vitals reviewed. Exam conducted with a chaperone present.  Constitutional:      Appearance: Normal appearance.  HENT:     Head: Normocephalic and atraumatic.     Right Ear: Tympanic membrane, ear canal and external ear normal.     Left Ear: Tympanic membrane, ear canal and external ear normal.  Eyes:     General:        Right eye: No discharge.        Left eye: No discharge.     Extraocular Movements: Extraocular movements intact.     Conjunctiva/sclera: Conjunctivae normal.     Pupils: Pupils are equal, round, and reactive to light.  Neck:     Vascular: No carotid bruit.  Cardiovascular:     Rate and Rhythm: Normal rate  and regular rhythm.     Pulses: Normal pulses.     Heart sounds: Normal heart sounds. No murmur heard. Pulmonary:     Effort: Pulmonary effort is normal.     Breath sounds: Normal breath sounds.  Chest:  Breasts:    Breasts are symmetrical.     Right: Normal.     Left: Normal.  Abdominal:     General: Abdomen is flat. Bowel sounds are normal. There is no distension.     Palpations: Abdomen is soft. There is no mass.     Tenderness: There is no abdominal tenderness.  Musculoskeletal:        General: No tenderness.     Cervical back: Neck supple. No muscular tenderness.     Right lower leg: No edema.     Left lower leg: No edema.  Lymphadenopathy:     Cervical: No cervical adenopathy.     Upper Body:     Right upper body: No supraclavicular adenopathy.     Left upper body: No supraclavicular adenopathy.  Skin:    General: Skin is warm and dry.  Neurological:     General: No focal deficit present.     Mental Status: She is alert and oriented to person, place, and time.     Motor: No weakness.     Gait: Gait normal.  Psychiatric:        Mood and Affect: Mood normal.        Behavior: Behavior normal.        Judgment: Judgment normal.      No results found for any visits on 06/06/23.     Assessment & Plan:    Routine Health Maintenance and Physical Exam  Immunization History  Administered Date(s) Administered   Influenza-Unspecified 05/11/2022, 02/07/2023   PFIZER(Purple Top)SARS-COV-2 Vaccination 08/27/2019, 09/18/2019, 05/13/2021   Tdap 02/08/2021   Unspecified SARS-COV-2 Vaccination 03/12/2023    Health Maintenance  Topic Date Due   Cervical Cancer Screening (HPV/Pap Cotest)  Never done   DTaP/Tdap/Td (2 - Td or Tdap) 02/09/2031   INFLUENZA VACCINE  Completed   COVID-19 Vaccine  Completed   Hepatitis C Screening  Completed   HIV Screening  Completed   HPV VACCINES  Aged Out    Discussed health benefits of physical activity, and encouraged her to engage in  regular exercise appropriate for her age and condition.  Problem List Items Addressed This Visit       Digestive   Gastroesophageal reflux disease   Chronic, intermittent, stale with PRN TUMS. Patient told to notify me if symptoms worsen.         Other   Encounter for general adult medical examination with abnormal findings - Primary   Discussed screening recommendations, handout provided.       Relevant Orders   CBC   Comprehensive metabolic panel   Hemoglobin A1c   Lipid panel   TSH   Obesity   Labs ordered, further recommendations may be made based upon his results       Relevant Orders   CBC   Comprehensive metabolic panel   Hemoglobin A1c   Lipid panel   TSH   Dizziness   Patient mentioned this me at end of exam. Notices this mostly around using the restroom and when she is on menstrual cycle. Etiology unclear. No syncope or near syncope, no chest pain, no palpitations. Check labs, neuro exam normal today. Further recommendations may be made based on lab results. Patient to reach out if symptoms become more bothersome or frequent.       Relevant Orders   Iron   Ferritin   Return in about 1 year (around 06/05/2024) for CPE with Ronae Noell.     Elenore Paddy, NP

## 2023-06-06 NOTE — Assessment & Plan Note (Signed)
Discussed screening recommendations, handout provided.

## 2023-06-06 NOTE — Assessment & Plan Note (Signed)
Labs ordered, further recommendations may be made based upon his results. 

## 2023-06-06 NOTE — Assessment & Plan Note (Signed)
Chronic, intermittent, stale with PRN TUMS. Patient told to notify me if symptoms worsen.

## 2023-06-07 ENCOUNTER — Other Ambulatory Visit: Payer: Self-pay | Admitting: Nurse Practitioner

## 2023-06-07 DIAGNOSIS — D72829 Elevated white blood cell count, unspecified: Secondary | ICD-10-CM

## 2023-06-07 LAB — COMPREHENSIVE METABOLIC PANEL
ALT: 20 U/L (ref 0–35)
AST: 17 U/L (ref 0–37)
Albumin: 4.2 g/dL (ref 3.5–5.2)
Alkaline Phosphatase: 61 U/L (ref 39–117)
BUN: 6 mg/dL (ref 6–23)
CO2: 26 meq/L (ref 19–32)
Calcium: 9.3 mg/dL (ref 8.4–10.5)
Chloride: 104 meq/L (ref 96–112)
Creatinine, Ser: 0.66 mg/dL (ref 0.40–1.20)
GFR: 113.31 mL/min (ref >60.00)
Glucose, Bld: 92 mg/dL (ref 70–99)
Potassium: 4.2 meq/L (ref 3.5–5.1)
Sodium: 138 meq/L (ref 135–145)
Total Bilirubin: 0.6 mg/dL (ref 0.2–1.2)
Total Protein: 7.3 g/dL (ref 6.0–8.3)

## 2023-06-07 LAB — CBC
HCT: 40.5 % (ref 36.0–46.0)
Hemoglobin: 13.6 g/dL (ref 12.0–15.0)
MCHC: 33.5 g/dL (ref 30.0–36.0)
MCV: 87.7 fL (ref 78.0–100.0)
Platelets: 300 10*3/uL (ref 150.0–400.0)
RBC: 4.62 Mil/uL (ref 3.87–5.11)
RDW: 13 % (ref 11.5–15.5)
WBC: 11.2 10*3/uL — ABNORMAL HIGH (ref 4.0–10.5)

## 2023-06-07 LAB — LIPID PANEL
Cholesterol: 188 mg/dL (ref 0–200)
HDL: 46.4 mg/dL (ref >39.00)
LDL Cholesterol: 113 mg/dL — ABNORMAL HIGH (ref 0–99)
NonHDL: 141.35
Total CHOL/HDL Ratio: 4
Triglycerides: 142 mg/dL (ref 0.0–149.0)
VLDL: 28.4 mg/dL (ref 0.0–40.0)

## 2023-06-07 LAB — FERRITIN: Ferritin: 22.9 ng/mL (ref 10.0–291.0)

## 2023-06-07 LAB — TSH: TSH: 1.76 u[IU]/mL (ref 0.35–5.50)

## 2023-06-07 LAB — IRON: Iron: 95 ug/dL (ref 42–145)

## 2023-06-07 LAB — HEMOGLOBIN A1C: Hgb A1c MFr Bld: 5.9 % (ref 4.6–6.5)

## 2023-06-24 ENCOUNTER — Encounter: Payer: Self-pay | Admitting: Cardiology

## 2023-06-24 ENCOUNTER — Ambulatory Visit: Payer: 59 | Attending: Cardiology | Admitting: Cardiology

## 2023-06-24 VITALS — BP 104/72 | HR 69 | Resp 16 | Ht 66.0 in | Wt 200.8 lb

## 2023-06-24 DIAGNOSIS — R0989 Other specified symptoms and signs involving the circulatory and respiratory systems: Secondary | ICD-10-CM | POA: Diagnosis not present

## 2023-06-24 DIAGNOSIS — R0789 Other chest pain: Secondary | ICD-10-CM

## 2023-06-24 DIAGNOSIS — R9431 Abnormal electrocardiogram [ECG] [EKG]: Secondary | ICD-10-CM | POA: Diagnosis not present

## 2023-06-24 NOTE — Patient Instructions (Addendum)
 Medication Instructions:  Your physician recommends that you continue on your current medications as directed. Please refer to the Current Medication list given to you today.  *If you need a refill on your cardiac medications before your next appointment, please call your pharmacy*   Lab Work: Have lab work done prior to MRI  (CBC and BMP).  This can be done at any [facility] location.  There is an office on the first floor of our building If you have labs (blood work) drawn today and your tests are completely normal, you will receive your results only by: MyChart Message (if you have MyChart) OR A paper copy in the mail If you have any lab test that is abnormal or we need to change your treatment, we will call you to review the results.   Testing/Procedures: Your physician has requested that you have a cardiac MRI. Cardiac MRI uses a computer to create images of your heart as its beating, producing both still and moving pictures of your heart and major blood vessels. For further information please visit [email or web address]. Please follow the instruction sheet given to you today for more information.    Follow-Up: At [facility], you and your health needs are our priority.  As part of our continuing mission to provide you with exceptional heart care, we have created designated Provider Care Teams.  These Care Teams include your primary Cardiologist (physician) and Advanced Practice Providers (APPs -  Physician Assistants and Nurse Practitioners) who all work together to provide you with the care you need, when you need it.  We recommend signing up for the patient portal called MyChart.  Sign up information is provided on this After Visit Summary.  MyChart is used to connect with patients for Virtual Visits (Telemedicine).  Patients are able to view lab/test results, encounter notes, upcoming appointments, etc.  Non-urgent messages can be sent to your provider as well.   To learn more  about what you can do with MyChart, go to [email or web address].    Your next appointment:   12 month(s)  Provider:   Gordy Bergamo, MD     Other Instructions     You are scheduled for Cardiac MRI at the location below.  Please arrive for your appointment at ______________ . ?  [facility] [address] [place], [place] [address] [phone] Please take advantage of the free valet parking available at the [facility] [employer] (Entrance C).  Proceed to the [facility] Radiology Department (First Floor) for check-in.   OR   [facility] [address] [place], [place] [address] [phone] Please go to the [facility] and check-in with the desk attendant.   Magnetic resonance imaging (MRI) is a painless test that produces images of the inside of the body without using Xrays.  During an MRI, strong magnets and radio waves work together in a data processing manager to form detailed images.   MRI images may provide more details about a medical condition than X-rays, CT scans, and ultrasounds can provide.  You may be given earphones to listen for instructions.  You may eat a light breakfast and take medications as ordered with the exception of furosemide, hydrochlorothiazide, chlorthalidone or spironolactone (or any other fluid pill). If you are undergoing a stress MRI, please avoid stimulants for 12 hr prior to test. (I.e. Caffeine, nicotine, chocolate, or antihistamine medications)  An IV will be inserted into one of your veins. Contrast material will be injected  into your IV. It will leave your body through your urine within a day. You may be told to drink plenty of fluids to help flush the contrast material out of your system.  You will be asked to remove all metal, including: Watch, jewelry, and other metal objects including hearing aids, hair pieces and dentures. Also wearable glucose monitoring systems (ie. Freestyle Libre and  Omnipods) (Braces and fillings normally are not a problem.)   TEST WILL TAKE APPROXIMATELY 1 HOUR  PLEASE NOTIFY SCHEDULING AT LEAST 24 HOURS IN ADVANCE IF YOU ARE UNABLE TO KEEP YOUR APPOINTMENT. [phone]  For more information and frequently asked questions, please visit our website : [email or web address]  Please call the Cardiac Imaging Nurse Navigators with any questions/concerns. [phone] Office

## 2023-06-24 NOTE — Progress Notes (Signed)
 Cardiology Office Note:  .   Date:  06/24/2023  ID:  Tracey Avila, DOB [birth year], MRN [id number] PCP: Elnor Lauraine BRAVO, NP   [facility] Providers Cardiologist:  Gordy Bergamo, MD   History of Present Illness: Tracey Avila is a 38 y.o. 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  She had COVID at that time, mildly abnormal EKG at the time with T wave abnormality in anterolateral leads and suspected pericarditis.  Discussed the use of AI scribe software for clinical note transcription with the patient, who gave verbal consent to proceed.  History of Present Illness   The patient, with a history of hypertension, presents with intermittent, sharp, localized chest pain. The pain, previously attributed to costochondritis, has decreased in frequency. She reports a family history of hypertension and heart disease. She has been monitoring her blood pressure, which has improved since reducing her salt intake. She denies smoking and illicit drug use. She has been active, achieving ten thousand steps daily without shortness of breath or chest pain. 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     Labs   Lab Results  Component Value Date   CHOL 188 06/06/2023   HDL 46.40 06/06/2023   LDLCALC 113 (H) 06/06/2023   TRIG 142.0 06/06/2023   CHOLHDL 4 06/06/2023   Lab Results  Component Value Date   NA 138 06/06/2023   K 4.2 06/06/2023   CO2 26 06/06/2023   GLUCOSE 92 06/06/2023   BUN 6 06/06/2023   CREATININE 0.66 06/06/2023   CALCIUM 9.3 06/06/2023   GFR 113.31 06/06/2023   GFRNONAA >60 12/31/2022      Latest Ref Rng & Units 06/06/2023    4:35 PM 12/31/2022   12:45  PM 12/31/2022   12:35 PM  BMP  Glucose 70 - 99 mg/dL 92  98  95   BUN 6 - 23 mg/dL 6  7  7    Creatinine 0.40 - 1.20 mg/dL 9.33  9.19  9.18   Sodium 135 - 145 mEq/L 138  139  137   Potassium 3.5 - 5.1 mEq/L 4.2  4.1  4.1   Chloride 96 - 112 mEq/L 104  107  107   CO2 19 - 32 mEq/L 26   20   Calcium 8.4 - 10.5 mg/dL 9.3   8.9       Latest Ref Rng & Units 06/06/2023    4:35 PM 12/31/2022   12:45 PM 12/31/2022   12:35 PM  CBC  WBC 4.0 - 10.5 K/uL 11.2   11.3   Hemoglobin 12.0 - 15.0 g/dL 86.3  85.6  85.9   Hematocrit 36.0 - 46.0 % 40.5  42.0  41.7   Platelets 150.0 - 400.0 K/uL 300.0   263   Review of Systems  Cardiovascular:  Negative for chest pain, dyspnea on exertion and leg swelling.    Physical Exam:   VS:  BP 104/72 (BP Location: Left Arm, Patient Position: Sitting, Cuff Size: Large)   Pulse 69   Resp 16   Ht 5' 6 (1.676 m)   Wt 200 lb 12.8 oz (91.1 kg)   SpO2 98%   BMI 32.41 kg/m  Wt Readings from Last 3 Encounters:  06/24/23 200 lb 12.8 oz (91.1 kg)  06/06/23 207 lb (93.9 kg)  01/18/23 195 lb 4 oz (88.6 kg)     Physical Exam Neck:     Vascular: No carotid bruit or JVD.  Cardiovascular:     Rate and Rhythm: Normal rate and regular rhythm.     Pulses: Intact distal pulses.     Heart sounds: Normal heart sounds. No murmur heard.    No gallop.  Pulmonary:     Effort: Pulmonary effort is normal.     Breath sounds: Normal breath sounds.  Abdominal:     General: Bowel sounds are normal.     Palpations: Abdomen is soft.  Musculoskeletal:     Right lower leg: No edema.     Left lower leg: No edema.    Studies Reviewed: .    CT angio of the chest, abdomen and pelvis 04/03/2021: Cardiovascular: Normal thoracic aorta. No cardiomegaly or pericardial effusion. Central pulmonary arteries appear to be patent. Normal proximal great vessels.  Mediastinum/Nodes: Negative. No mediastinal mass or lymphadenopathy.  Lungs/Pleura: Major airways are patent. Lung volumes  are normal and both lungs appear clear. No pneumothorax or pleural effusion. 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 Recommend GYN surgery follow-up.  Echocardiogram 04/25/2021: Normal LV systolic function with visual EF 60-65%. Left ventricle cavity is normal in size. Normal global wall motion. Normal diastolic filling pattern, normal LAP. Mild tricuspid regurgitation. No prior study for comparison. EKG:    EKG Interpretation Date/Time:  Monday June 24 2023 16:10:34 EST Ventricular Rate:  61 PR Interval:  138 QRS Duration:  98 QT Interval:  414 QTC Calculation: 416 R Axis:   12  Text Interpretation: EKG 06/24/2023: Normal sinus rhythm with sinus arrhythmia at rate of 61 bpm, normal axis, IRBBB.  1 mm ST depression with deep T wave inversion in inferior leads and anterolateral leads, consider subendocardial ischemia versus infarct.  Compared to 04/03/2021, inferolateral ST-T changes are new. Confirmed by Gabriellah Rabel, Jagadeesh ([id number]) on 06/24/2023 4:33:25 PM    Medications and allergies    Allergies  Allergen Reactions   Bee Venom Anaphylaxis   Wasp Venom Anaphylaxis    Current Outpatient Medications:    albuterol (VENTOLIN HFA) 108 (90 Base) MCG/ACT inhaler, Inhale 2 puffs into the lungs every 4 (four) hours as needed., Disp: , Rfl:    EPINEPHrine 0.3 mg/0.3 mL IJ SOAJ injection, Inject into the muscle as directed., Disp: , Rfl:    LORazepam (ATIVAN) 0.5 MG tablet, Take 1 mg by mouth daily as needed., Disp: , Rfl:    ASSESSMENT AND PLAN: .      ICD-10-CM   1. Musculoskeletal chest pain  R07.89 Basic Metabolic Panel (BMET)    CBC    2. Nonspecific abnormal electrocardiogram (ECG) (EKG)  R94.31 EKG 12-Lead    MR CARDIAC MORPHOLOGY W WO CONTRAST    Basic Metabolic Panel (BMET)    CBC      Assessment and Plan    Abnormal EKG Noted worsening abnormality on EKG compared to previous. No symptoms of chest pain or shortness of breath with heavy  physical activity. Normal cholesterol, blood pressure, and no history of smoking or cocaine use. Previous echocardiogram was normal. -Order cardiac MRI to evaluate for possible structural or pericardial disease. -Do not suspect ischemic heart disease, in the absence of diabetes, smoking, chest pain on exertion, patient getting about >10,000 steps a day without any  chest pain or dyspnea.  LDL is minimally elevated, patient is aware of risk modification including weight loss and diet.  Costochondritis Intermittent sharp chest pain in a localized area, improved from previous. -Continue current management.  Hypertension Blood pressure well controlled with lifestyle modifications including reduced salt intake. -Continue current management.  Weight Management Recent weight gain due to change in job activity level. Patient is actively working on weight loss. -Encourage continued efforts for weight loss.  Follow-up in 1 year if cardiac MRI is normal.       Signed,  Gordy Bergamo, MD, [facility] 06/24/2023, 9:20 PM [facility] [address] [place], [place] [address] Phone: [phone]. Fax:  [phone]

## 2023-07-05 LAB — CBC

## 2023-07-06 ENCOUNTER — Encounter: Payer: Self-pay | Admitting: Cardiology

## 2023-07-06 LAB — BASIC METABOLIC PANEL
BUN/Creatinine Ratio: 9 (ref 9–23)
BUN: 7 mg/dL (ref 6–20)
CO2: 22 mmol/L (ref 20–29)
Calcium: 9.1 mg/dL (ref 8.7–10.2)
Chloride: 101 mmol/L (ref 96–106)
Creatinine, Ser: 0.77 mg/dL (ref 0.57–1.00)
Glucose: 97 mg/dL (ref 70–99)
Potassium: 4 mmol/L (ref 3.5–5.2)
Sodium: 138 mmol/L (ref 134–144)
eGFR: 103 mL/min/{1.73_m2} (ref >59)

## 2023-07-06 LAB — CBC
Hematocrit: 39.9 % (ref 34.0–46.6)
Hemoglobin: 13.7 g/dL (ref 11.1–15.9)
MCH: 30.1 pg (ref 26.6–33.0)
MCHC: 34.3 g/dL (ref 31.5–35.7)
MCV: 88 fL (ref 79–97)
Platelets: 243 10*3/uL (ref 150–450)
RBC: 4.55 x10E6/uL (ref 3.77–5.28)
RDW: 12.3 % (ref 11.7–15.4)
WBC: 6.7 10*3/uL (ref 3.4–10.8)

## 2023-08-09 ENCOUNTER — Encounter (HOSPITAL_COMMUNITY): Payer: Self-pay

## 2023-08-12 ENCOUNTER — Other Ambulatory Visit: Payer: Self-pay | Admitting: Cardiology

## 2023-08-12 ENCOUNTER — Ambulatory Visit (HOSPITAL_COMMUNITY)
Admission: RE | Admit: 2023-08-12 | Discharge: 2023-08-12 | Disposition: A | Discharge status: Home / Self Care | Payer: 59 | Source: Ambulatory Visit | Attending: Cardiology | Admitting: Cardiology

## 2023-08-12 ENCOUNTER — Encounter: Payer: Self-pay | Admitting: Cardiology

## 2023-08-12 DIAGNOSIS — R9431 Abnormal electrocardiogram [ECG] [EKG]: Secondary | ICD-10-CM | POA: Insufficient documentation

## 2023-08-12 MED ORDER — GADOBUTROL 1 MMOL/ML IV SOLN
10.0000 mL | Freq: Once | INTRAVENOUS | Status: AC | PRN
  Administered 2023-08-12: 10 mL via INTRAVENOUS

## 2023-08-12 NOTE — Progress Notes (Signed)
 Normal MRI without suggestion of pericarditis

## 2024-05-21 ENCOUNTER — Encounter: Payer: Self-pay | Admitting: Cardiology

## 2024-06-12 ENCOUNTER — Encounter: Payer: 59 | Admitting: Nurse Practitioner

## 2024-08-06 ENCOUNTER — Encounter: Payer: Self-pay | Admitting: Nurse Practitioner
